# Patient Record
Sex: Female | Born: 1975 | Race: White | Hispanic: No | Marital: Single | Smoking: Never smoker
Health system: Southern US, Community
[De-identification: ages and names within clinical notes are randomized; demographics above are authoritative.]

## PROBLEM LIST (undated history)

## (undated) DIAGNOSIS — Z8619 Personal history of other infectious and parasitic diseases: Secondary | ICD-10-CM

## (undated) DIAGNOSIS — E079 Disorder of thyroid, unspecified: Secondary | ICD-10-CM

## (undated) DIAGNOSIS — D497 Neoplasm of unspecified behavior of endocrine glands and other parts of nervous system: Secondary | ICD-10-CM

## (undated) DIAGNOSIS — I10 Essential (primary) hypertension: Secondary | ICD-10-CM

## (undated) DIAGNOSIS — E2740 Unspecified adrenocortical insufficiency: Secondary | ICD-10-CM

## (undated) DIAGNOSIS — K219 Gastro-esophageal reflux disease without esophagitis: Secondary | ICD-10-CM

## (undated) HISTORY — DX: Neoplasm of unspecified behavior of endocrine glands and other parts of nervous system: D49.7

## (undated) HISTORY — DX: Unspecified adrenocortical insufficiency: E27.40

## (undated) HISTORY — DX: Gastro-esophageal reflux disease without esophagitis: K21.9

## (undated) HISTORY — PX: TRANSPHENOIDAL / TRANSNASAL HYPOPHYSECTOMY / RESECTION PITUITARY TUMOR: SUR1382

## (undated) HISTORY — DX: Essential (primary) hypertension: I10

## (undated) HISTORY — PX: EYE SURGERY: SHX253

## (undated) HISTORY — DX: Disorder of thyroid, unspecified: E07.9

## (undated) HISTORY — DX: Personal history of other infectious and parasitic diseases: Z86.19

---

## 1998-08-16 ENCOUNTER — Emergency Department (HOSPITAL_COMMUNITY): Admission: EM | Admit: 1998-08-16 | Discharge: 1998-08-17 | Payer: Self-pay | Admitting: Emergency Medicine

## 1998-08-20 ENCOUNTER — Encounter: Payer: Self-pay | Admitting: Emergency Medicine

## 1998-08-20 ENCOUNTER — Ambulatory Visit (HOSPITAL_COMMUNITY): Admission: RE | Admit: 1998-08-20 | Discharge: 1998-08-20 | Payer: Self-pay | Admitting: Emergency Medicine

## 1998-09-07 ENCOUNTER — Encounter: Payer: Self-pay | Admitting: Gastroenterology

## 1998-09-07 ENCOUNTER — Ambulatory Visit (HOSPITAL_COMMUNITY): Admission: RE | Admit: 1998-09-07 | Discharge: 1998-09-07 | Payer: Self-pay | Admitting: Gastroenterology

## 1998-09-17 ENCOUNTER — Encounter: Payer: Self-pay | Admitting: Emergency Medicine

## 1998-09-17 ENCOUNTER — Emergency Department (HOSPITAL_COMMUNITY): Admission: EM | Admit: 1998-09-17 | Discharge: 1998-09-17 | Payer: Self-pay | Admitting: Emergency Medicine

## 1998-09-19 ENCOUNTER — Ambulatory Visit (HOSPITAL_COMMUNITY): Admission: RE | Admit: 1998-09-19 | Discharge: 1998-09-19 | Payer: Self-pay | Admitting: Gastroenterology

## 1998-09-19 ENCOUNTER — Emergency Department (HOSPITAL_COMMUNITY): Admission: EM | Admit: 1998-09-19 | Discharge: 1998-09-20 | Payer: Self-pay | Admitting: Emergency Medicine

## 1998-10-24 ENCOUNTER — Other Ambulatory Visit: Admission: RE | Admit: 1998-10-24 | Discharge: 1998-10-24 | Payer: Self-pay | Admitting: Obstetrics and Gynecology

## 2001-01-25 ENCOUNTER — Other Ambulatory Visit: Admission: RE | Admit: 2001-01-25 | Discharge: 2001-01-25 | Payer: Self-pay | Admitting: Obstetrics and Gynecology

## 2002-01-31 ENCOUNTER — Other Ambulatory Visit: Admission: RE | Admit: 2002-01-31 | Discharge: 2002-01-31 | Payer: Self-pay | Admitting: Obstetrics and Gynecology

## 2002-09-28 ENCOUNTER — Emergency Department (HOSPITAL_COMMUNITY): Admission: EM | Admit: 2002-09-28 | Discharge: 2002-09-29 | Payer: Self-pay | Admitting: Emergency Medicine

## 2002-09-29 ENCOUNTER — Encounter: Payer: Self-pay | Admitting: Internal Medicine

## 2002-09-29 ENCOUNTER — Observation Stay (HOSPITAL_COMMUNITY): Admission: EM | Admit: 2002-09-29 | Discharge: 2002-09-30 | Payer: Self-pay | Admitting: Emergency Medicine

## 2002-09-30 ENCOUNTER — Encounter (INDEPENDENT_AMBULATORY_CARE_PROVIDER_SITE_OTHER): Payer: Self-pay | Admitting: Cardiology

## 2002-10-05 ENCOUNTER — Encounter: Payer: Self-pay | Admitting: Family Medicine

## 2002-10-05 ENCOUNTER — Encounter: Admission: RE | Admit: 2002-10-05 | Discharge: 2002-10-05 | Payer: Self-pay | Admitting: Family Medicine

## 2002-12-11 ENCOUNTER — Ambulatory Visit (HOSPITAL_COMMUNITY): Admission: RE | Admit: 2002-12-11 | Discharge: 2002-12-11 | Payer: Self-pay | Admitting: Endocrinology

## 2002-12-13 ENCOUNTER — Observation Stay (HOSPITAL_COMMUNITY): Admission: EM | Admit: 2002-12-13 | Discharge: 2002-12-14 | Payer: Self-pay | Admitting: Emergency Medicine

## 2002-12-20 ENCOUNTER — Ambulatory Visit (HOSPITAL_COMMUNITY): Admission: RE | Admit: 2002-12-20 | Discharge: 2002-12-20 | Payer: Self-pay | Admitting: Otolaryngology

## 2003-02-08 ENCOUNTER — Other Ambulatory Visit: Admission: RE | Admit: 2003-02-08 | Discharge: 2003-02-08 | Payer: Self-pay | Admitting: Obstetrics and Gynecology

## 2003-09-14 HISTORY — PX: OTHER SURGICAL HISTORY: SHX169

## 2004-02-13 ENCOUNTER — Other Ambulatory Visit: Admission: RE | Admit: 2004-02-13 | Discharge: 2004-02-13 | Payer: Self-pay | Admitting: Obstetrics and Gynecology

## 2004-04-10 ENCOUNTER — Ambulatory Visit (HOSPITAL_COMMUNITY): Admission: RE | Admit: 2004-04-10 | Discharge: 2004-04-10 | Payer: Self-pay | Admitting: Neurosurgery

## 2004-05-17 ENCOUNTER — Ambulatory Visit (HOSPITAL_COMMUNITY): Admission: RE | Admit: 2004-05-17 | Discharge: 2004-05-17 | Payer: Self-pay | Admitting: Endocrinology

## 2004-12-25 ENCOUNTER — Other Ambulatory Visit: Admission: RE | Admit: 2004-12-25 | Discharge: 2004-12-25 | Payer: Self-pay | Admitting: Obstetrics and Gynecology

## 2006-05-18 ENCOUNTER — Other Ambulatory Visit: Admission: RE | Admit: 2006-05-18 | Discharge: 2006-05-18 | Payer: Self-pay | Admitting: Obstetrics and Gynecology

## 2006-10-12 ENCOUNTER — Ambulatory Visit (HOSPITAL_COMMUNITY): Admission: RE | Admit: 2006-10-12 | Discharge: 2006-10-12 | Payer: Self-pay | Admitting: Endocrinology

## 2007-05-28 ENCOUNTER — Other Ambulatory Visit: Admission: RE | Admit: 2007-05-28 | Discharge: 2007-05-28 | Payer: Self-pay | Admitting: Obstetrics and Gynecology

## 2007-07-20 ENCOUNTER — Emergency Department (HOSPITAL_COMMUNITY): Admission: EM | Admit: 2007-07-20 | Discharge: 2007-07-20 | Payer: Self-pay | Admitting: Family Medicine

## 2008-05-30 ENCOUNTER — Other Ambulatory Visit: Admission: RE | Admit: 2008-05-30 | Discharge: 2008-05-30 | Payer: Self-pay | Admitting: Obstetrics and Gynecology

## 2009-05-30 ENCOUNTER — Other Ambulatory Visit: Admission: RE | Admit: 2009-05-30 | Discharge: 2009-05-30 | Payer: Self-pay | Admitting: Obstetrics and Gynecology

## 2009-10-22 ENCOUNTER — Encounter: Admission: RE | Admit: 2009-10-22 | Discharge: 2009-10-22 | Payer: Self-pay | Admitting: Endocrinology

## 2009-10-24 ENCOUNTER — Ambulatory Visit: Payer: Self-pay | Admitting: Oncology

## 2009-10-26 ENCOUNTER — Encounter: Admission: RE | Admit: 2009-10-26 | Discharge: 2009-10-26 | Payer: Self-pay | Admitting: Endocrinology

## 2010-02-02 ENCOUNTER — Encounter: Payer: Self-pay | Admitting: Endocrinology

## 2010-02-02 ENCOUNTER — Encounter: Payer: Self-pay | Admitting: Otolaryngology

## 2010-05-31 NOTE — Discharge Summary (Signed)
[name], [name]                       ACCOUNT NO.:  [id number]   MEDICAL RECORD NO.:  [id number]                   PATIENT TYPE:  INP   LOCATION:  2004                                 FACILITY:  [facility]   PHYSICIAN:  [name], M.D.             DATE OF BIRTH:  09-10-1975   DATE OF ADMISSION:  12/13/2002  DATE OF DISCHARGE:  12/14/2002                                 DISCHARGE SUMMARY   DISCHARGE DIAGNOSES:  1. Near-syncope.  2. Headaches with transient fever and generalized weakness and dizziness,     likely secondary to viral syndrome, improved.  3. History of tachycardia of unknown etiology with complete cardiac     evaluation.  4. Probable Cushing's syndrome, patient currently undergoing endocrinologic     evaluation.   DISCHARGE MEDICATIONS:  The patient should continue all her preadmission  medications.  She may use Tylenol over-the-counter for headaches, which are  currently very mild and significantly improved.   DISCHARGE LABORATORY DATA:  WBC count 6.7, hemoglobin 15.5, hematocrit 44.5,  MCV 92.3, platelet count 204.  Sodium 138, potassium 4.0, chloride 105, CO2  25, glucose 91, BUN 5, creatinine 0.8, calcium 9.1.   CONSULTATIONS:  Not applicable.   PROCEDURES:  Not applicable.   CONDITION ON DISCHARGE:  Improved and stable.   DISCHARGE INSTRUCTIONS:  Activity is to be as tolerated.  The patient is to  increase her fluid intake.  Diet to be a regular diet.  She will follow up  with Dorisann Snow, M.D., of endocrinology at [facility] on  Friday, December 16, 2002, at 8:20 a.m.  She is to report to M.D. if there  are any problems including but not limited to dizziness, generalized  weakness, palpitations, chest pain, orthopnea, fever, or chills.   REASON FOR ADMISSION:  Presyncope.  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  She had been in her  usual state of health until sometime  on the evening of presentation, then started having some headaches, followed  by some dizziness with ________.  She also felt slightly weak.  She did not  have any other significant cardiopulmonary symptomatology.  At the ED she  was found to be febrile with tachycardia of 138 per minute and a temperature  of 101.1 degrees Fahrenheit, and therefore the hospitalist service was  called to evaluate the patient.  She was subsequently admitted for  management and observation and evaluation of her presyncopal episode.  Please see the admission H&P dictated by me on December 13, 2002, for  further insight, the patient's admitting symptoms and signs, assessment and  plan.   HOSPITAL COURSE:  She was admitted to telemetry monitoring.  She was started  on her home dose of Toprol XL, and she also received supportive care for  headaches.  She received IV fluid supplementation.  Cardiac enzymes serially  were obtained, which ruled out any myocardial infarction.  Her UA was  unremarkable.  Chest x-ray was negative for any acute infiltrates.  This  morning the patient's symptoms have completely resolved.  She is not having  any significant headaches, she is afebrile, her heart rate is down to 88 per  minute by her radial pulse this morning.  She does not have any dizziness,  and her energy level was improved and she is ready for discharge.  It is of  note that this patient has had a history of tachycardia for which she saw  Dr. Fraser Din in the hospital a few months ago and also in the outpatient  setting.  She has had several cardiac evaluations, including stresses and 2  D echo, which have been unremarkable.  She has a history of hypertension,  for which she takes Toprol XL to control her high blood pressure as well as  her tachycardia.  According to the patient, she had a similar episode a few  months ago and it is believed that her problems are related to some   underlying endocrinologic issue.  I discussed the patient extensively with  Dr. Talmage Snow, who had done an MRI on November 28, the results of which are  negative for any pituitary pathology, and she is thinking of possible  pituitary venous sampling at [facility] or [facility].  She is going to see the  patient in two days, i.e., Friday, to discuss these management plans.  At  bedside today I spent a good amount of time discussing the patient with the  patient's grandparents as well as the patient, and they agreed that she has  had extensive cardiac workup and that appropriate follow-up with Dr. Talmage Snow  would be appreciated, and therefore I called Dr. Talmage Snow and asked for an  appointment on Friday as noted above.  The patient discharged home in stable  and satisfactory condition.   Her discharge BP is 144/94, with a heart rate of 88 per minute.  Her  telemetry monitoring did not reveal any dysrhythmia.  She does not have any  cardiopulmonary complaints at this point in time.  At this time the cause of  the patient's presyncopal episode is unclear; however, differentials would  include vasovagal reaction to the headache plus or minus a viral syndrome in  view of her fever and headaches and some mild general weakness.   NOTE:  I spent more than 30 minutes preparing this patient for discharge  discussing the follow-up and follow-up options with the patient and her  family at bedside today.  I also called Dr. Talmage Snow by telephone to discuss  the patient's clinical condition with her.                                                [name], M.D.    GO/MEDQ  D:  12/14/2002  T:  12/14/2002  Job:  [id number]   cc:   Meade Maw, M.D.  [address]  [place]  [place] [address]  Fax: [phone]   Dorisann Snow, M.D.  [phone] [address], [place] [address]  Fax: [phone]

## 2010-05-31 NOTE — H&P (Signed)
[name], Snow                       ACCOUNT NO.:  [id number]   MEDICAL RECORD NO.:  [id number]                   PATIENT TYPE:  INP   LOCATION:  2004                                 FACILITY:  [facility]   PHYSICIAN:  [name], M.D.             DATE OF BIRTH:  1975-11-22   DATE OF ADMISSION:  12/13/2002  DATE OF DISCHARGE:                                HISTORY & PHYSICAL   PROBLEM LIST:  1. Presyncope.  2. Headaches.  3. History of tachycardia.   CHIEF COMPLAINT:  Presyncope.   HISTORY OF PRESENT ILLNESS:  The patient is a 35 year old Caucasian lady who  presents with above chief complaint. She had apparently been in her usual  state of health until some time this evening when she started complaining of  some headaches for about three hours followed by some dizziness without any  vertiginous component. She did not have any shortness of breath, nausea,  vomiting, generalized weakness or visual changes. She was transported to the  ER by EMS on account of light-headedness making her feel like she was going  to pass out. She does not give any history of chest pain, palpitations,  paroxysmal nocturnal dyspnea or orthopnea but admitted to feeling generally  weak for a transient period. At the ED, the patient was noted to be  hypertensive with a BP of 180/105 and tachycardic at 138 and febrile at  101.1 and therefore were called to evaluate for admission.   PAST MEDICAL HISTORY:  The patient has a history of tachycardia for which  she was admitted by Dr. Tresa Endo a few months ago. At this admission,  evaluation with a TSA, 2-D echocardiogram and _________ were all  unremarkable for cardiac causes or any other specific causes. She was also  seen by Dr. Fraser Din during this admission as an outpatient according to the  patient and she was told that the cause of her ________ is unclear. The  patient is currently being evaluated by Dr. Lurene Shadow of endocrinology for  pituitary  causes of Cushing's disease.  In fact, she had an MRI on November  28 at [facility] and the result is on the computer and official  report is read as being normal for MI of the brain and the pituitary gland.  The patient has not had the chance to discuss these findings with Dr.  Lurene Shadow at this moment. Currently she is getting Toprol for the treatment of  her hypertension with tachycardia of unknown etiology.   MEDICATIONS:  She is allergic to PENICILLIN and ERYTHROMYCIN.  She takes  Toprol 200 mg b.i.d.  She also takes birth control pills.   SOCIAL HISTORY:  The patient is single, she does not have any children, she  is a Engineer, civil (consulting) at [employer]. She does not smoke cigarettes nor drink  alcohol.   REVIEW OF SYMPTOMS:  Negative as in HPI.  __________ unremarkable.   PHYSICAL EXAMINATION:  VITAL SIGNS:  Blood pressure was 138/107, pulse rate  of 128, respiratory rate 30 as documented by the ED nurse, however, her  respiratory rate was 22 at the time of my evaluation. O2 saturation 99% on  room air. Temperature which was 101.1 was 99.2 at the time of my evaluation.  GENERAL:  Not in acute cardiopulmonary distress.  CNS:  Pupils were equal, round and reactive to light. She did not have any  positive Brudzinski or Kernig's sign. There were no focal deficits.  HEENT:  Normocephalic, atraumatic. Extraocular movements intact. She was not  pale. She did not have any dryness of her mucous membranes.  NECK:  Supple with no thyromegaly.  LUNGS:  Clear to auscultation.  CARDIAC:  Notable for tachycardia without any gallops or murmur.  ABDOMEN:  Full, no significant hepatosplenomegaly. Bowel sounds were within  normal limits.  EXTREMITIES:  Negative for any pedal edema.  SKIN:  Warm and dry without any lesions.   LABORATORY DATA:  WBC count 5.9, hemoglobin 15.6, hematocrit 44.5, MCV 92.8,  platelet count 265. Urinalysis was negative.   ASSESSMENT:  Presyncopal episode likely  secondary to vasovagal reaction to  her moderate headaches which has improved at the time of my evaluation. The  patient has fever, etiology of which is unclear at this moment. We do not  think that she has meningitis. The patient does not have any focal deficits,  she is not ill looking. She does not have any neck stiffness on exam or by  history. She does not have any positive Brudzinski or Kernig's sign.   PLAN:  Admit the patient for 24 hour observation. We will start her on beta  blockade for control of her tachycardia which apparently has been  extensively worked up. The cause of her fever is unclear as mentioned above.  We will repeat a rectal temperature and get x-ray with urinalysis amongst  others to rule out any infectious etiology. She will be offered supportive  care for now.                                                [name], M.D.    GO/MEDQ  D:  12/13/2002  T:  12/13/2002  Job:  [id number]   cc:   Leonie Man, M.D.  [address]  [place]  [place] [address]  Fax: [phone]

## 2010-09-09 ENCOUNTER — Other Ambulatory Visit: Payer: Self-pay | Admitting: Obstetrics and Gynecology

## 2010-09-09 ENCOUNTER — Other Ambulatory Visit (HOSPITAL_COMMUNITY)
Admission: RE | Admit: 2010-09-09 | Discharge: 2010-09-09 | Disposition: A | Discharge status: Home / Self Care | Payer: Medicare Other | Source: Ambulatory Visit | Attending: Obstetrics and Gynecology | Admitting: Obstetrics and Gynecology

## 2010-09-09 DIAGNOSIS — Z124 Encounter for screening for malignant neoplasm of cervix: Secondary | ICD-10-CM | POA: Insufficient documentation

## 2010-10-24 LAB — GLUCOSE, RANDOM
Glucose, Bld: 75
Glucose, Bld: 77
Glucose, Bld: 80
Glucose, Bld: 80

## 2010-10-24 LAB — GROWTH HORMONE
Growth Hormone: 0.08
Growth Hormone: 0.11
Growth Hormone: 0.2
Growth Hormone: 0.37

## 2011-09-12 ENCOUNTER — Other Ambulatory Visit: Payer: Self-pay | Admitting: Obstetrics and Gynecology

## 2011-09-12 ENCOUNTER — Other Ambulatory Visit (HOSPITAL_COMMUNITY)
Admission: RE | Admit: 2011-09-12 | Discharge: 2011-09-12 | Disposition: A | Discharge status: Home / Self Care | Payer: Medicare Other | Source: Ambulatory Visit | Attending: Obstetrics and Gynecology | Admitting: Obstetrics and Gynecology

## 2011-09-12 DIAGNOSIS — Z124 Encounter for screening for malignant neoplasm of cervix: Secondary | ICD-10-CM | POA: Insufficient documentation

## 2012-12-13 ENCOUNTER — Other Ambulatory Visit: Payer: Self-pay | Admitting: Obstetrics and Gynecology

## 2012-12-13 ENCOUNTER — Other Ambulatory Visit (HOSPITAL_COMMUNITY)
Admission: RE | Admit: 2012-12-13 | Discharge: 2012-12-13 | Disposition: A | Discharge status: Home / Self Care | Payer: Medicare Other | Source: Ambulatory Visit | Attending: Obstetrics and Gynecology | Admitting: Obstetrics and Gynecology

## 2012-12-13 DIAGNOSIS — Z01419 Encounter for gynecological examination (general) (routine) without abnormal findings: Secondary | ICD-10-CM | POA: Insufficient documentation

## 2012-12-13 DIAGNOSIS — Z1151 Encounter for screening for human papillomavirus (HPV): Secondary | ICD-10-CM | POA: Insufficient documentation

## 2013-12-27 ENCOUNTER — Other Ambulatory Visit (HOSPITAL_COMMUNITY)
Admission: RE | Admit: 2013-12-27 | Discharge: 2013-12-27 | Disposition: A | Discharge status: Home / Self Care | Payer: Medicare Other | Source: Ambulatory Visit | Attending: Obstetrics and Gynecology | Admitting: Obstetrics and Gynecology

## 2013-12-27 ENCOUNTER — Other Ambulatory Visit: Payer: Self-pay | Admitting: Obstetrics and Gynecology

## 2013-12-27 DIAGNOSIS — Z124 Encounter for screening for malignant neoplasm of cervix: Secondary | ICD-10-CM | POA: Diagnosis present

## 2013-12-27 DIAGNOSIS — Z1151 Encounter for screening for human papillomavirus (HPV): Secondary | ICD-10-CM | POA: Insufficient documentation

## 2013-12-30 LAB — CYTOLOGY - PAP

## 2015-03-27 DIAGNOSIS — R638 Other symptoms and signs concerning food and fluid intake: Secondary | ICD-10-CM | POA: Diagnosis not present

## 2015-04-03 DIAGNOSIS — E2749 Other adrenocortical insufficiency: Secondary | ICD-10-CM | POA: Diagnosis not present

## 2015-04-03 DIAGNOSIS — E240 Pituitary-dependent Cushing's disease: Secondary | ICD-10-CM | POA: Diagnosis not present

## 2015-04-03 DIAGNOSIS — E230 Hypopituitarism: Secondary | ICD-10-CM | POA: Diagnosis not present

## 2015-04-03 DIAGNOSIS — I10 Essential (primary) hypertension: Secondary | ICD-10-CM | POA: Diagnosis not present

## 2015-07-23 DIAGNOSIS — H5213 Myopia, bilateral: Secondary | ICD-10-CM | POA: Diagnosis not present

## 2015-09-04 DIAGNOSIS — R7301 Impaired fasting glucose: Secondary | ICD-10-CM | POA: Diagnosis not present

## 2015-09-04 DIAGNOSIS — E038 Other specified hypothyroidism: Secondary | ICD-10-CM | POA: Diagnosis not present

## 2015-09-04 DIAGNOSIS — E230 Hypopituitarism: Secondary | ICD-10-CM | POA: Diagnosis not present

## 2015-09-04 DIAGNOSIS — E2749 Other adrenocortical insufficiency: Secondary | ICD-10-CM | POA: Diagnosis not present

## 2015-09-04 DIAGNOSIS — E240 Pituitary-dependent Cushing's disease: Secondary | ICD-10-CM | POA: Diagnosis not present

## 2015-09-04 DIAGNOSIS — E28310 Symptomatic premature menopause: Secondary | ICD-10-CM | POA: Diagnosis not present

## 2015-09-04 LAB — BASIC METABOLIC PANEL
BUN: 13 mg/dL (ref 4–21)
CREATININE: 0.8 mg/dL (ref <=1.1)
Glucose: 86 mg/dL
POTASSIUM: 4 mmol/L (ref 3.4–5.3)
SODIUM: 139 mmol/L (ref 137–147)

## 2015-09-04 LAB — HEMOGLOBIN A1C: Hemoglobin A1C: 5.4

## 2015-10-18 DIAGNOSIS — Z23 Encounter for immunization: Secondary | ICD-10-CM | POA: Diagnosis not present

## 2015-10-18 DIAGNOSIS — I10 Essential (primary) hypertension: Secondary | ICD-10-CM | POA: Diagnosis not present

## 2015-10-18 DIAGNOSIS — E230 Hypopituitarism: Secondary | ICD-10-CM | POA: Diagnosis not present

## 2015-10-18 DIAGNOSIS — E2749 Other adrenocortical insufficiency: Secondary | ICD-10-CM | POA: Diagnosis not present

## 2015-10-18 DIAGNOSIS — R5383 Other fatigue: Secondary | ICD-10-CM | POA: Diagnosis not present

## 2015-10-18 DIAGNOSIS — E240 Pituitary-dependent Cushing's disease: Secondary | ICD-10-CM | POA: Diagnosis not present

## 2015-10-18 DIAGNOSIS — Z7952 Long term (current) use of systemic steroids: Secondary | ICD-10-CM | POA: Diagnosis not present

## 2015-10-18 LAB — HEPATIC FUNCTION PANEL
ALK PHOS: 50 U/L (ref 25–125)
ALT: 32 U/L (ref 7–35)
AST: 33 U/L (ref 13–35)
Bilirubin, Direct: 0.11 mg/dL
Bilirubin, Total: 0.4 mg/dL

## 2015-10-18 LAB — CBC AND DIFFERENTIAL
HCT: 45 % (ref 36–46)
HEMOGLOBIN: 15.3 g/dL (ref 12.0–16.0)
WBC: 8.5 10^3/mL

## 2015-10-18 LAB — HM DEXA SCAN: HM Dexa Scan: NORMAL

## 2015-10-18 LAB — VITAMIN D 25 HYDROXY (VIT D DEFICIENCY, FRACTURES): VIT D 25 HYDROXY: 41.3

## 2015-12-09 DIAGNOSIS — R5383 Other fatigue: Secondary | ICD-10-CM | POA: Diagnosis not present

## 2015-12-09 DIAGNOSIS — R0683 Snoring: Secondary | ICD-10-CM | POA: Diagnosis not present

## 2016-05-02 ENCOUNTER — Encounter: Payer: Self-pay | Admitting: Physician Assistant

## 2016-05-02 ENCOUNTER — Other Ambulatory Visit: Payer: Self-pay | Admitting: Physician Assistant

## 2016-05-02 ENCOUNTER — Ambulatory Visit (INDEPENDENT_AMBULATORY_CARE_PROVIDER_SITE_OTHER): Payer: Medicare Other | Admitting: Physician Assistant

## 2016-05-02 VITALS — BP 130/88 | HR 104 | Temp 98.8°F | Resp 16 | Ht 65.0 in | Wt 234.0 lb

## 2016-05-02 DIAGNOSIS — K2990 Gastroduodenitis, unspecified, without bleeding: Secondary | ICD-10-CM | POA: Diagnosis not present

## 2016-05-02 DIAGNOSIS — E2740 Unspecified adrenocortical insufficiency: Secondary | ICD-10-CM | POA: Diagnosis not present

## 2016-05-02 DIAGNOSIS — E039 Hypothyroidism, unspecified: Secondary | ICD-10-CM | POA: Diagnosis not present

## 2016-05-02 DIAGNOSIS — E240 Pituitary-dependent Cushing's disease: Secondary | ICD-10-CM | POA: Diagnosis not present

## 2016-05-02 LAB — COMPREHENSIVE METABOLIC PANEL
ALT: 12 U/L (ref 0–35)
AST: 11 U/L (ref 0–37)
Albumin: 4.4 g/dL (ref 3.5–5.2)
Alkaline Phosphatase: 40 U/L (ref 39–117)
BUN: 11 mg/dL (ref 6–23)
CO2: 28 mEq/L (ref 19–32)
Calcium: 9.5 mg/dL (ref 8.4–10.5)
Chloride: 102 mEq/L (ref 96–112)
Creatinine, Ser: 0.88 mg/dL (ref 0.40–1.20)
GFR: 76.26 mL/min (ref >60.00)
Glucose, Bld: 95 mg/dL (ref 70–99)
Potassium: 4.2 mEq/L (ref 3.5–5.1)
Sodium: 138 mEq/L (ref 135–145)
Total Bilirubin: 0.7 mg/dL (ref 0.2–1.2)
Total Protein: 7.2 g/dL (ref 6.0–8.3)

## 2016-05-02 LAB — CBC
HCT: 45.6 % (ref 36.0–46.0)
Hemoglobin: 15.7 g/dL — ABNORMAL HIGH (ref 12.0–15.0)
MCHC: 34.3 g/dL (ref 30.0–36.0)
MCV: 91.3 fl (ref 78.0–100.0)
Platelets: 254 10*3/uL (ref 150.0–400.0)
RBC: 5 Mil/uL (ref 3.87–5.11)
RDW: 13.4 % (ref 11.5–15.5)
WBC: 10.2 10*3/uL (ref 4.0–10.5)

## 2016-05-02 LAB — H. PYLORI ANTIBODY, IGG: H Pylori IgG: NEGATIVE

## 2016-05-02 MED ORDER — SUCRALFATE 1 G PO TABS: 1.0000 g | ORAL_TABLET | Freq: Three times a day (TID) | ORAL | 0 refills | Status: DC

## 2016-05-02 MED ORDER — PANTOPRAZOLE SODIUM 40 MG PO TBEC: 40.0000 mg | DELAYED_RELEASE_TABLET | Freq: Every day | ORAL | 3 refills | Status: DC

## 2016-05-02 NOTE — Progress Notes (Signed)
Patient presents to clinic today to establish care.  Acute Concerns: Patient with history of GERD, currently on Prilosec. Notes over the past several weeks having increased heart burn with epigastric pain occurring about 30 minutes after meals. Note pain improves while eating. Denies melena, hematochezia or tenesmus. Denies history of ulceration. Denies any alcohol consumption or NSAID use.  Chronic Issues: Cushing's Disease/Hypopituitarism/Hypothyroidism -- Followed by Endocrinology Chalmers Cater) and GYN. Endorses taking medications as directed. Has follow-up scheduled with Dr. Chalmers Cater.   Health Maintenance: Immunizations -- Unsure of Tetanus. Will obtain records. PAP --  Up-to-date/  Past Medical History:  Diagnosis Date  . Adrenal insufficiency ([place])   . GERD (gastroesophageal reflux disease)   . History of chickenpox   . Hypertension   . Pituitary tumor   . Thyroid disease    Cushing's Disease    Past Surgical History:  Procedure Laterality Date  . gamma knife radiation  09/2003  . TRANSPHENOIDAL / TRANSNASAL HYPOPHYSECTOMY / RESECTION PITUITARY TUMOR  02/2003, 07/2003    No current outpatient prescriptions on file prior to visit.   No current facility-administered medications on file prior to visit.     Allergies  Allergen Reactions  . Erythromycin Nausea And Vomiting  . Penicillins Rash    Family History  Problem Relation Age of Onset  . Hypertension Paternal Aunt   . Hyperlipidemia Paternal Aunt   . Drug abuse Paternal Uncle   . Stroke Paternal Uncle   . Heart attack Paternal Uncle   . Hypertension Maternal Grandfather   . Hyperlipidemia Maternal Grandfather   . Cancer Maternal Grandfather     Prostate  . Heart attack Maternal Grandfather   . Hyperlipidemia Paternal Grandmother   . Hypertension Paternal Grandmother   . Dementia Paternal Grandmother   . Hyperlipidemia Paternal Grandfather   . Hypertension Paternal Grandfather   . COPD Paternal Grandfather     . Diabetes Paternal Grandfather   . Heart attack Paternal Grandfather   . Cancer Paternal Aunt     Breast    Social History   Social History  . Marital status: Single    Spouse name: N/A  . Number of children: N/A  . Years of education: N/A   Occupational History  . Not on file.   Social History Main Topics  . Smoking status: Never Smoker  . Smokeless tobacco: Never Used  . Alcohol use No  . Drug use: No  . Sexual activity: Yes    Birth control/ protection: Condom   Other Topics Concern  . Not on file   Social History Narrative  . No narrative on file   Review of Systems  Constitutional: Negative for chills, fever and weight loss.  Eyes: Negative for blurred vision and double vision.  Respiratory: Negative for cough and shortness of breath.   Cardiovascular: Negative for chest pain and palpitations.  Gastrointestinal: Positive for abdominal pain, heartburn and nausea. Negative for blood in stool, constipation, diarrhea and vomiting.  Genitourinary: Negative for dysuria, flank pain, frequency and hematuria.  Neurological: Negative for dizziness.  Psychiatric/Behavioral: Negative for depression. The patient is not nervous/anxious and does not have insomnia.    BP 130/88   Pulse (!) 104   Temp 98.8 F (37.1 C) (Oral)   Resp 16   Ht 5\' 5"  (1.651 m)   Wt 234 lb (106.1 kg)   SpO2 99%   BMI 38.94 kg/m   Physical Exam  Constitutional: She is oriented to person, place, and time and well-developed, well-nourished,  and in no distress.  HENT:  Head: Normocephalic and atraumatic.  Moon facies noted.   Eyes: Conjunctivae are normal.  Neck: Neck supple.  Cardiovascular: Normal rate, regular rhythm, normal heart sounds and intact distal pulses.   Pulmonary/Chest: Effort normal and breath sounds normal. No respiratory distress. She has no wheezes. She has no rales. She exhibits no tenderness.  Abdominal: Soft. Normal appearance and bowel sounds are normal. There is no  hepatosplenomegaly. There is tenderness in the epigastric area and left upper quadrant. There is no rigidity, no rebound, no guarding, no CVA tenderness and no tenderness at McBurney's point.  Neurological: She is alert and oriented to person, place, and time. No cranial nerve deficit.  Skin: Skin is warm and dry. No rash noted.  Psychiatric: Affect normal.  Vitals reviewed.  Assessment/Plan: Adrenal insufficiency ([place]) Followed by Endocrinology. Continue care as directed by specialist.  Hypothyroid Tolerating levothyroxine. Followed by Endocrinology. Follow-up with specialist as scheduled.   Cushing's disease ([place]) Followed by Endo. Continue care as discussed by specialist.   Gastritis and duodenitis Despite current Prilosec use. Concern for ulcer giving history. Discussed appropriate diet. Start probiotic daily. Will switch Prilosec for Protonix. Start Carafate. Avoid NSAIDs. No alcohol. Will check labs today to include CBC, Lipase, h. Pylori. IFOB given. If labs unremarkable and symptoms not quickly improving, will refer to GI for EGD and further management.     Leeanne Rio, PA-C

## 2016-05-02 NOTE — Progress Notes (Signed)
Pre visit review using our clinic review tool, if applicable. No additional management support is needed unless otherwise documented below in the visit note. 

## 2016-05-02 NOTE — Patient Instructions (Signed)
Please stop the Omeprazole. Start the Protonix and the Carafate as directed. Continue avoidance of trigger foods.   I am checking labs today to further assess. If all negative, we will be setting you up with Gastroenterology for an upper endoscopy.   Please follow-up with me at earliest convenience for a complete physical.   It was a pleasure meeting you today and we look forward to participating in your care!

## 2016-05-04 DIAGNOSIS — K2990 Gastroduodenitis, unspecified, without bleeding: Secondary | ICD-10-CM | POA: Insufficient documentation

## 2016-05-04 NOTE — Assessment & Plan Note (Signed)
Followed by Endocrinology. Continue care as directed by specialist. 

## 2016-05-04 NOTE — Assessment & Plan Note (Signed)
Despite current Prilosec use. Concern for ulcer giving history. Discussed appropriate diet. Start probiotic daily. Will switch Prilosec for Protonix. Start Carafate. Avoid NSAIDs. No alcohol. Will check labs today to include CBC, Lipase, h. Pylori. IFOB given. If labs unremarkable and symptoms not quickly improving, will refer to GI for EGD and further management.

## 2016-05-04 NOTE — Assessment & Plan Note (Signed)
Tolerating levothyroxine. Followed by Endocrinology. Follow-up with specialist as scheduled.

## 2016-05-04 NOTE — Assessment & Plan Note (Signed)
Followed by Endo. Continue care as discussed by specialist.

## 2016-05-05 ENCOUNTER — Other Ambulatory Visit: Payer: Self-pay | Admitting: Physician Assistant

## 2016-05-05 ENCOUNTER — Encounter: Payer: Self-pay | Admitting: Gastroenterology

## 2016-05-05 DIAGNOSIS — K2990 Gastroduodenitis, unspecified, without bleeding: Secondary | ICD-10-CM

## 2016-05-16 ENCOUNTER — Encounter: Payer: Self-pay | Admitting: Physician Assistant

## 2016-05-16 DIAGNOSIS — K2990 Gastroduodenitis, unspecified, without bleeding: Secondary | ICD-10-CM

## 2016-05-16 MED ORDER — SUCRALFATE 1 G PO TABS: 1.0000 g | ORAL_TABLET | Freq: Three times a day (TID) | ORAL | 0 refills | Status: DC

## 2016-05-19 ENCOUNTER — Other Ambulatory Visit: Payer: Self-pay | Admitting: Physician Assistant

## 2016-05-19 DIAGNOSIS — K2990 Gastroduodenitis, unspecified, without bleeding: Secondary | ICD-10-CM

## 2016-05-22 ENCOUNTER — Encounter: Payer: Self-pay | Admitting: Emergency Medicine

## 2016-05-26 ENCOUNTER — Encounter: Payer: Self-pay | Admitting: Physician Assistant

## 2016-05-26 DIAGNOSIS — E230 Hypopituitarism: Secondary | ICD-10-CM | POA: Insufficient documentation

## 2016-06-05 ENCOUNTER — Other Ambulatory Visit: Payer: Self-pay | Admitting: Family Medicine

## 2016-06-05 DIAGNOSIS — K2990 Gastroduodenitis, unspecified, without bleeding: Secondary | ICD-10-CM

## 2016-06-10 ENCOUNTER — Ambulatory Visit (INDEPENDENT_AMBULATORY_CARE_PROVIDER_SITE_OTHER): Payer: Medicare Other | Admitting: Gastroenterology

## 2016-06-10 ENCOUNTER — Encounter: Payer: Self-pay | Admitting: Gastroenterology

## 2016-06-10 VITALS — BP 140/90 | HR 88 | Ht 65.0 in | Wt 226.0 lb

## 2016-06-10 DIAGNOSIS — R1013 Epigastric pain: Secondary | ICD-10-CM

## 2016-06-10 DIAGNOSIS — R12 Heartburn: Secondary | ICD-10-CM | POA: Diagnosis not present

## 2016-06-10 NOTE — Progress Notes (Signed)
HPI: This is a very pleasant 41 year old woman  who was referred to me by Brunetta Jeans, PA-C  to evaluate  dyspepsia, epigastric burning .    Chief complaint is epigastric burning, dyspepsia, pyrosis  She has epiagstric burning, pains.  For past 2 months.  This is nearly constant.  Tomato sauces, fried foods make it worse.  Some foods can relieve the discomfort.  Also pyrosis into her chest.  Has been on PPI for years, started after her brain surgery.  Rarely had heartburn prior to past 2 months.  Has lost 10 poiunds in past 2 months.  Never takes NSAIDs.  Started protonix about a month, usually takes it after breakfast.  Takes carafate tid usually before breakfast.  She has noticed a slight improvement  Can be particularly bad at night laying down.  No dysphagia.  Old Data Reviewed:  Blood tests April 2018: Normal CBC, normal complete metabolic profile, H. pylori serology negative.   Review of systems: Pertinent positive and negative review of systems were noted in the above HPI section. All other review negative.   Past Medical History:  Diagnosis Date  . Adrenal insufficiency ([place])   . GERD (gastroesophageal reflux disease)   . History of chickenpox   . Hypertension   . Pituitary tumor   . Thyroid disease    Cushing's Disease    Past Surgical History:  Procedure Laterality Date  . gamma knife radiation  09/2003  . TRANSPHENOIDAL / TRANSNASAL HYPOPHYSECTOMY / RESECTION PITUITARY TUMOR  02/2003, 07/2003    Current Outpatient Prescriptions  Medication Sig Dispense Refill  . calcium carbonate (CALCIUM 600) 600 MG TABS tablet Take 600 mg by mouth daily with breakfast.    . Cholecalciferol (VITAMIN D) 2000 units CAPS Take 1 capsule by mouth daily.    Marland Kitchen levothyroxine (SYNTHROID, LEVOTHROID) 125 MCG tablet Take 1 tablet by mouth daily.  5  . LOPREEZA 1-0.5 MG tablet Take 1 tablet by mouth daily.  0  . metoprolol succinate (TOPROL-XL) 25 MG 24 hr tablet Take 3  tablets by mouth daily.    . pantoprazole (PROTONIX) 40 MG tablet Take 1 tablet (40 mg total) by mouth daily. 30 tablet 3  . predniSONE (DELTASONE) 5 MG tablet Take 1 tablet by mouth daily.  5  . Somatropin (NORDITROPIN) 5 MG/1.5ML SOLN Inject 1 mL into the skin at bedtime.    . sucralfate (CARAFATE) 1 g tablet Take 1 tablet (1 g total) by mouth 3 (three) times daily with meals. 60 tablet 0   No current facility-administered medications for this visit.     Allergies as of 06/10/2016 - Review Complete 06/10/2016  Allergen Reaction Noted  . Erythromycin Nausea And Vomiting 11/01/2010  . Penicillins Rash 11/01/2010    Family History  Problem Relation Age of Onset  . Hypertension Paternal Aunt   . Hyperlipidemia Paternal Aunt   . Drug abuse Paternal Uncle   . Stroke Paternal Uncle   . Heart attack Paternal Uncle   . Hypertension Maternal Grandfather   . Hyperlipidemia Maternal Grandfather   . Cancer Maternal Grandfather        Prostate  . Heart attack Maternal Grandfather   . Hyperlipidemia Paternal Grandmother   . Hypertension Paternal Grandmother   . Dementia Paternal Grandmother   . Hyperlipidemia Paternal Grandfather   . Hypertension Paternal Grandfather   . COPD Paternal Grandfather   . Diabetes Paternal Grandfather   . Heart attack Paternal Grandfather   . Cancer Paternal Aunt  Breast    Social History   Social History  . Marital status: Single    Spouse name: N/A  . Number of children: N/A  . Years of education: N/A   Occupational History  . Not on file.   Social History Main Topics  . Smoking status: Never Smoker  . Smokeless tobacco: Never Used  . Alcohol use No  . Drug use: No  . Sexual activity: Yes    Birth control/ protection: Condom   Other Topics Concern  . Not on file   Social History Narrative  . No narrative on file     Physical Exam: BP 140/90   Pulse 88   Ht 5\' 5"  (1.651 m)   Wt 226 lb (102.5 kg)   BMI 37.61 kg/m   Constitutional: generally well-appearing Psychiatric: alert and oriented x3 Eyes: extraocular movements intact Mouth: oral pharynx moist, no lesions Neck: supple no lymphadenopathy Cardiovascular: heart regular rate and rhythm Lungs: clear to auscultation bilaterally Abdomen: soft, nontender, nondistended, no obvious ascites, no peritoneal signs, normal bowel sounds Extremities: no lower extremity edema bilaterally Skin: no lesions on visible extremities   Assessment and plan: 41 y.o. female with  likely acid, GERD related symptoms  She has epigastric discomfort which is a burning sensation for her also some intermittent pyrosis. I suspect these are acid related. She is not taking her antiacid medicine at correct time and relation to meals and so she is going to correct that. I also don't think Carafate is necessary for now at least and so she is going to stop taking that. I recommended she have upper endoscopy to check for peptic ulcer disease, gastritis, H pylori infection of the stomach, significant GERD damage.    Please see the "Patient Instructions" section for addition details about the plan.   Owens Loffler, MD [facility] 06/10/2016, 2:44 PM  Cc: Brunetta Jeans, PA-C

## 2016-06-10 NOTE — Patient Instructions (Addendum)
Stop the carafate.  You should change the way you are taking your antiacid medicine (protonix) so that you are taking it 20-30 minutes prior to a decent meal as that is the way the pill is designed to work most effectively.  Take pepcid (or zantac) at bedtime every night.  You will be set up for an upper endoscopy for epigastric burning.

## 2016-07-07 ENCOUNTER — Other Ambulatory Visit (HOSPITAL_COMMUNITY)
Admission: RE | Admit: 2016-07-07 | Discharge: 2016-07-07 | Disposition: A | Discharge status: Home / Self Care | Payer: Medicare Other | Source: Ambulatory Visit | Attending: Obstetrics and Gynecology | Admitting: Obstetrics and Gynecology

## 2016-07-07 ENCOUNTER — Other Ambulatory Visit: Payer: Self-pay | Admitting: Obstetrics and Gynecology

## 2016-07-07 DIAGNOSIS — E288 Other ovarian dysfunction: Secondary | ICD-10-CM | POA: Diagnosis not present

## 2016-07-07 DIAGNOSIS — Z01419 Encounter for gynecological examination (general) (routine) without abnormal findings: Secondary | ICD-10-CM | POA: Diagnosis present

## 2016-07-07 DIAGNOSIS — Z01411 Encounter for gynecological examination (general) (routine) with abnormal findings: Secondary | ICD-10-CM | POA: Diagnosis not present

## 2016-07-08 LAB — CYTOLOGY - PAP
ADEQUACY: ABSENT
Diagnosis: NEGATIVE

## 2016-07-18 ENCOUNTER — Encounter: Payer: Self-pay | Admitting: Gastroenterology

## 2016-07-29 ENCOUNTER — Encounter: Payer: Self-pay | Admitting: Gastroenterology

## 2016-07-29 ENCOUNTER — Ambulatory Visit (AMBULATORY_SURGERY_CENTER): Payer: Medicare Other | Admitting: Gastroenterology

## 2016-07-29 VITALS — BP 146/90 | HR 84 | Temp 97.3°F | Resp 12 | Ht 65.0 in | Wt 226.0 lb

## 2016-07-29 DIAGNOSIS — K2990 Gastroduodenitis, unspecified, without bleeding: Secondary | ICD-10-CM

## 2016-07-29 DIAGNOSIS — K219 Gastro-esophageal reflux disease without esophagitis: Secondary | ICD-10-CM | POA: Diagnosis not present

## 2016-07-29 DIAGNOSIS — I10 Essential (primary) hypertension: Secondary | ICD-10-CM | POA: Diagnosis not present

## 2016-07-29 DIAGNOSIS — R1013 Epigastric pain: Secondary | ICD-10-CM | POA: Diagnosis present

## 2016-07-29 DIAGNOSIS — K2970 Gastritis, unspecified, without bleeding: Secondary | ICD-10-CM

## 2016-07-29 DIAGNOSIS — K2950 Unspecified chronic gastritis without bleeding: Secondary | ICD-10-CM | POA: Diagnosis not present

## 2016-07-29 MED ORDER — SODIUM CHLORIDE 0.9 % IV SOLN: 500.0000 mL | INTRAVENOUS | Status: DC

## 2016-07-29 NOTE — Progress Notes (Signed)
Called to room to assist during endoscopic procedure.  Patient ID and intended procedure confirmed with present staff. Received instructions for my participation in the procedure from the performing physician.  

## 2016-07-29 NOTE — Progress Notes (Signed)
Report given to PACU, vss 

## 2016-07-29 NOTE — Op Note (Signed)
[facility] Patient Name: [name] Procedure Date: 07/29/2016 2:33 PM MRN: [id number] Endoscopist: Milus Banister , MD Age: 41 Referring MD:  Date of Birth: [birth year] Gender: Female Account #: [id number] Procedure:                Upper GI endoscopy Indications:              Epigastric abdominal pain, Heartburn Medicines:                Monitored Anesthesia Care Procedure:                Pre-Anesthesia Assessment:                           - Prior to the procedure, a History and Physical                            was performed, and patient medications and                            allergies were reviewed. The patient's tolerance of                            previous anesthesia was also reviewed. The risks                            and benefits of the procedure and the sedation                            options and risks were discussed with the patient.                            All questions were answered, and informed consent                            was obtained. Prior Anticoagulants: The patient has                            taken no previous anticoagulant or antiplatelet                            agents. ASA Grade Assessment: II - A patient with                            mild systemic disease. After reviewing the risks                            and benefits, the patient was deemed in                            satisfactory condition to undergo the procedure.                           After obtaining informed consent, the endoscope was  passed under direct vision. Throughout the                            procedure, the patient's blood pressure, pulse, and                            oxygen saturations were monitored continuously. The                            Model GIF-HQ190 [id number]) scope was introduced                            through the mouth, and advanced to the second part                            of  duodenum. The upper GI endoscopy was                            accomplished without difficulty. The patient                            tolerated the procedure well. Scope In: Scope Out: Findings:                 The esophagus was normal.                           Minimal inflammation characterized by erythema and                            friability was found in the gastric antrum.                            Biopsies were taken with a cold forceps for                            histology.                           The examined duodenum was normal. Complications:            No immediate complications. Estimated blood loss:                            None. Estimated Blood Loss:     Estimated blood loss: none. Impression:               - Normal esophagus.                           - Mild gastritis. Biopsied.                           - Normal examined duodenum. Recommendation:           - Patient has a contact number available for  emergencies. The signs and symptoms of potential                            delayed complications were discussed with the                            patient. Return to normal activities tomorrow.                            Written discharge instructions were provided to the                            patient.                           - Resume previous diet.                           - Continue present medications.                           - Await pathology results. If negative for H.                            pylori, will likely continue workup with abdominal                            ultraound (checking for gallstones). Milus Banister, MD 07/29/2016 2:46:53 PM This report has been signed electronically.

## 2016-07-29 NOTE — Patient Instructions (Signed)
YOU HAD AN ENDOSCOPIC PROCEDURE TODAY AT THE [facility]:   Refer to the procedure report that was given to you for any specific questions about what was found during the examination.  If the procedure report does not answer your questions, please call your gastroenterologist to clarify.  If you requested that your care partner not be given the details of your procedure findings, then the procedure report has been included in a sealed envelope for you to review at your convenience later.  YOU SHOULD EXPECT: Some feelings of bloating in the abdomen. Passage of more gas than usual.  Walking can help get rid of the air that was put into your GI tract during the procedure and reduce the bloating. If you had a lower endoscopy (such as a colonoscopy or flexible sigmoidoscopy) you may notice spotting of blood in your stool or on the toilet paper. If you underwent a bowel prep for your procedure, you may not have a normal bowel movement for a few days.  Please Note:  You might notice some irritation and congestion in your nose or some drainage.  This is from the oxygen used during your procedure.  There is no need for concern and it should clear up in a day or so.  SYMPTOMS TO REPORT IMMEDIATELY:     Following upper endoscopy (EGD)  Vomiting of blood or coffee ground material  New chest pain or pain under the shoulder blades  Painful or persistently difficult swallowing  New shortness of breath  Fever of 100F or higher  Black, tarry-looking stools  For urgent or emergent issues, a gastroenterologist can be reached at any hour by calling [phone].   DIET:  We do recommend a small meal at first, but then you may proceed to your regular diet.  Drink plenty of fluids but you should avoid alcoholic beverages for 24 hours.  ACTIVITY:  You should plan to take it easy for the rest of today and you should NOT DRIVE or use heavy machinery until tomorrow (because of the sedation medicines  used during the test).    FOLLOW UP: Our staff will call the number listed on your records the next business day following your procedure to check on you and address any questions or concerns that you may have regarding the information given to you following your procedure. If we do not reach you, we will leave a message.  However, if you are feeling well and you are not experiencing any problems, there is no need to return our call.  We will assume that you have returned to your regular daily activities without incident.  If any biopsies were taken you will be contacted by phone or by letter within the next 1-3 weeks.  Please call us at [phone] if you have not heard about the biopsies in 3 weeks.    SIGNATURES/CONFIDENTIALITY: You and/or your care partner have signed paperwork which will be entered into your electronic medical record.  These signatures attest to the fact that that the information above on your After Visit Summary has been reviewed and is understood.  Full responsibility of the confidentiality of this discharge information lies with you and/or your care-partner.   Resume medications. Information given on Gastritis. 

## 2016-07-30 ENCOUNTER — Telehealth: Payer: Self-pay | Admitting: *Deleted

## 2016-07-30 NOTE — Telephone Encounter (Signed)
Phone not ringing.

## 2016-07-30 NOTE — Telephone Encounter (Signed)
  Follow up Call-  Call back number 07/29/2016  Post procedure Call Back phone  # [phone]  Permission to leave phone message Yes  Some recent data might be hidden     Patient questions:  Unable to call out.  Second call.

## 2016-08-08 ENCOUNTER — Other Ambulatory Visit: Payer: Self-pay

## 2016-08-08 DIAGNOSIS — R1013 Epigastric pain: Secondary | ICD-10-CM

## 2016-08-08 NOTE — Progress Notes (Signed)
You have been scheduled for an abdominal ultrasound at [facility] Radiology (1st floor of hospital) on 08/12/16 at 930  am. Please arrive 15 minutes prior to your appointment for registration. Make certain not to have anything to eat or drink 6 hours prior to your appointment. Should you need to reschedule your appointment, please contact radiology at [phone]. This test typically takes about 30 minutes to perform.  The pt has been advised of the appt and instructed

## 2016-08-12 ENCOUNTER — Ambulatory Visit (HOSPITAL_COMMUNITY)
Admission: RE | Admit: 2016-08-12 | Discharge: 2016-08-12 | Disposition: A | Discharge status: Home / Self Care | Payer: Medicare Other | Source: Ambulatory Visit | Attending: Gastroenterology | Admitting: Gastroenterology

## 2016-08-12 DIAGNOSIS — R109 Unspecified abdominal pain: Secondary | ICD-10-CM | POA: Diagnosis not present

## 2016-08-12 DIAGNOSIS — R1013 Epigastric pain: Secondary | ICD-10-CM | POA: Diagnosis not present

## 2016-08-21 ENCOUNTER — Other Ambulatory Visit: Payer: Self-pay | Admitting: Physician Assistant

## 2016-08-21 DIAGNOSIS — K2990 Gastroduodenitis, unspecified, without bleeding: Secondary | ICD-10-CM

## 2016-09-09 ENCOUNTER — Other Ambulatory Visit (INDEPENDENT_AMBULATORY_CARE_PROVIDER_SITE_OTHER): Payer: Medicare Other

## 2016-09-09 ENCOUNTER — Ambulatory Visit (INDEPENDENT_AMBULATORY_CARE_PROVIDER_SITE_OTHER): Payer: Medicare Other | Admitting: Gastroenterology

## 2016-09-09 ENCOUNTER — Encounter: Payer: Self-pay | Admitting: Gastroenterology

## 2016-09-09 VITALS — BP 158/98 | HR 84 | Ht 65.0 in | Wt 219.0 lb

## 2016-09-09 DIAGNOSIS — R197 Diarrhea, unspecified: Secondary | ICD-10-CM | POA: Diagnosis not present

## 2016-09-09 DIAGNOSIS — R12 Heartburn: Secondary | ICD-10-CM

## 2016-09-09 DIAGNOSIS — R1013 Epigastric pain: Secondary | ICD-10-CM

## 2016-09-09 LAB — IGA: IgA: 192 mg/dL (ref 68–378)

## 2016-09-09 NOTE — Progress Notes (Signed)
Review of pertinent gastrointestinal problems: 1. GERD-like dyspepsia: Labs 04/2016 CBC, CMET normal. EGD Dr. Ardis Hughs 2018 found mild gastritis, h pylori negative. 07/2016 [place] was normal.   HPI: This is a very pleasant 41 year old [name] whom I last saw time of an upper endoscopy.  Chief complaint is persistent epigastric burning, also chronic diarrhea  Has burning with any food intake.  Has tried cutting out acidic foods.  Started in February, never before then.  Has pain, is not excruciating.   Has chronic loose stools.  If diarrhea is severe can see blood onTP.  Takes protonix 40mg  daily.  pepcid two pills at bedtime nightly.  No nsaids.   ROS: complete GI ROS as described in HPI, all other review negative.  Constitutional:  No unintentional weight loss   Past Medical History:  Diagnosis Date  . Adrenal insufficiency ([place])   . GERD (gastroesophageal reflux disease)   . History of chickenpox   . Hypertension   . Pituitary tumor   . Thyroid disease    Cushing's Disease    Past Surgical History:  Procedure Laterality Date  . gamma knife radiation  09/2003  . TRANSPHENOIDAL / TRANSNASAL HYPOPHYSECTOMY / RESECTION PITUITARY TUMOR  02/2003, 07/2003    Current Outpatient Prescriptions  Medication Sig Dispense Refill  . calcium carbonate (CALCIUM 600) 600 MG TABS tablet Take 600 mg by mouth daily with breakfast.    . Cholecalciferol (VITAMIN D) 2000 units CAPS Take 1 capsule by mouth daily.    . famotidine (PEPCID AC MAXIMUM STRENGTH) 20 MG tablet Take 40 mg by mouth at bedtime.    Marland Kitchen levothyroxine (SYNTHROID, LEVOTHROID) 125 MCG tablet Take 1 tablet by mouth daily.  5  . LOPREEZA 1-0.5 MG tablet Take 1 tablet by mouth daily.  0  . metoprolol succinate (TOPROL-XL) 25 MG 24 hr tablet Take 3 tablets by mouth daily.    . pantoprazole (PROTONIX) 40 MG tablet TAKE 1 TABLET(40 MG) BY MOUTH DAILY 90 tablet 0  . predniSONE (DELTASONE) 5 MG tablet Take 1 tablet by mouth daily.  5  .  Somatropin (NORDITROPIN) 5 MG/1.5ML SOLN Inject 1 mL into the skin at bedtime.    . sucralfate (CARAFATE) 1 g tablet Take 1 tablet (1 g total) by mouth 3 (three) times daily with meals. 60 tablet 0   Current Facility-Administered Medications  Medication Dose Route Frequency Provider Last Rate Last Dose  . 0.9 %  sodium chloride infusion  500 mL Intravenous Continuous Milus Banister, MD        Allergies as of 09/09/2016 - Review Complete 09/09/2016  Allergen Reaction Noted  . Erythromycin Nausea And Vomiting 11/01/2010  . Penicillins Rash 11/01/2010    Family History  Problem Relation Age of Onset  . Hypertension Paternal Aunt   . Hyperlipidemia Paternal Aunt   . Drug abuse Paternal Uncle   . Stroke Paternal Uncle   . Heart attack Paternal Uncle   . Hypertension Maternal Grandfather   . Hyperlipidemia Maternal Grandfather   . Heart attack Maternal Grandfather   . Prostate cancer Maternal Grandfather   . Hyperlipidemia Paternal Grandmother   . Hypertension Paternal Grandmother   . Dementia Paternal Grandmother   . Hyperlipidemia Paternal Grandfather   . Hypertension Paternal Grandfather   . COPD Paternal Grandfather   . Diabetes Paternal Grandfather   . Heart attack Paternal Grandfather   . Breast cancer Paternal Aunt   . Colon cancer Neg Hx   . Stomach cancer Neg Hx   .  Esophageal cancer Neg Hx     Social History   Social History  . Marital status: Single    Spouse name: N/A  . Number of children: N/A  . Years of education: N/A   Occupational History  . Not on file.   Social History Main Topics  . Smoking status: Never Smoker  . Smokeless tobacco: Never Used  . Alcohol use No  . Drug use: No  . Sexual activity: Yes    Birth control/ protection: Condom   Other Topics Concern  . Not on file   Social History Narrative  . No narrative on file     Physical Exam: BP (!) 158/98   Pulse 84   Ht 5\' 5"  (1.651 m)   Wt 219 lb (99.3 kg)   BMI 36.Julie kg/m   Constitutional: generally well-appearing Psychiatric: alert and oriented x3 Abdomen: soft, nontender, nondistended, no obvious ascites, no peritoneal signs, normal bowel sounds No peripheral edema noted in lower extremities  Assessment and plan: 41 y.o. female with Persistent epigastric burning, chronic loose stools  She is of [place] heritage and because of that and her symptoms are going to test her for celiac sprue with serologies. Her symptoms of burning are more classic for GERD and at the celiac testing is negative then I would likely increase her antiacid control to proton pump inhibitor twice daily. I think if that were the case also I would like to proceed with colonoscopy since she has chronic diarrhea that is not explained.  Please see the "Patient Instructions" section for addition details about the plan.  Owens Loffler, MD [facility] 09/09/2016, 1:58 PM

## 2016-09-09 NOTE — Patient Instructions (Addendum)
You will have labs checked today in the basement lab.  Please head down after you check out with the front desk  (IgA, tTG).  If negative then will increase your antiacids (to protonix twice daily) and see how you respond.  Normal BMI (Body Mass Index- based on height and weight) is between 19 and 25. Your BMI today is Body mass index is 36.44 kg/m. Marland Kitchen Please consider follow up  regarding your BMI with your Primary Care Provider.

## 2016-09-10 LAB — TISSUE TRANSGLUTAMINASE, IGA: Tissue Transglutaminase Ab, IgA: 1 U/mL (ref <4)

## 2016-09-16 ENCOUNTER — Other Ambulatory Visit: Payer: Self-pay

## 2016-09-16 DIAGNOSIS — K2990 Gastroduodenitis, unspecified, without bleeding: Secondary | ICD-10-CM

## 2016-09-16 MED ORDER — PANTOPRAZOLE SODIUM 40 MG PO TBEC: 40.0000 mg | DELAYED_RELEASE_TABLET | Freq: Two times a day (BID) | ORAL | 3 refills | Status: DC

## 2016-10-06 ENCOUNTER — Ambulatory Visit (AMBULATORY_SURGERY_CENTER): Payer: Self-pay

## 2016-10-06 VITALS — Ht 64.5 in | Wt 222.2 lb

## 2016-10-06 DIAGNOSIS — R197 Diarrhea, unspecified: Secondary | ICD-10-CM

## 2016-10-06 MED ORDER — SUPREP BOWEL PREP KIT 17.5-3.13-1.6 GM/177ML PO SOLN: 1.0000 | Freq: Once | ORAL | 0 refills | Status: AC

## 2016-10-06 NOTE — Progress Notes (Signed)
No allergies allergies to eggs or soy No past problems with anesthesia No diet meds No home oxygen  Registered emmi

## 2016-10-07 ENCOUNTER — Encounter: Payer: Self-pay | Admitting: Gastroenterology

## 2016-10-09 ENCOUNTER — Telehealth: Payer: Self-pay | Admitting: Gastroenterology

## 2016-10-09 DIAGNOSIS — J3089 Other allergic rhinitis: Secondary | ICD-10-CM | POA: Diagnosis not present

## 2016-10-09 DIAGNOSIS — R7301 Impaired fasting glucose: Secondary | ICD-10-CM | POA: Diagnosis not present

## 2016-10-09 DIAGNOSIS — R21 Rash and other nonspecific skin eruption: Secondary | ICD-10-CM | POA: Diagnosis not present

## 2016-10-09 DIAGNOSIS — E038 Other specified hypothyroidism: Secondary | ICD-10-CM | POA: Diagnosis not present

## 2016-10-09 DIAGNOSIS — E559 Vitamin D deficiency, unspecified: Secondary | ICD-10-CM | POA: Diagnosis not present

## 2016-10-09 DIAGNOSIS — E230 Hypopituitarism: Secondary | ICD-10-CM | POA: Diagnosis not present

## 2016-10-09 NOTE — Telephone Encounter (Signed)
Patient states she just spoke with her endocrinologist and they want her to have her pain medication the day of her procedure. Pt would like a call to discuss.

## 2016-10-09 NOTE — Telephone Encounter (Signed)
Pt states her endocrinologist, Dr. Chalmers Cater, states she needs to have Hydrocortisone 50mg  either IM or IV 19min prior to her procedure on Monday. Pt states she needs this due to adrenal insufficiency. Pt wants to know if she can get this here in the [facility] or if she needs to try to go by Dr. Almetta Lovely office prior to the procedure. Please advise.

## 2016-10-10 NOTE — Telephone Encounter (Signed)
Thanks for letting us know. 

## 2016-10-10 NOTE — Telephone Encounter (Signed)
I spoke with Dr. Chalmers Cater this morning. She does advise stress dose steroids, hydrocortisone half an hour to an hour prior to her colonoscopy. Her office is close to the lower endoscopy Center and so we are going to advise the patient to stop by her endocrinologist office at 2:30, get her IM shot, and then proceed directly to the lower endoscopy Center for her colonoscopy which is scheduled to start at 3:30.  Patty can you please advise [facility] on the above. She needs to show up at her endocrinologist office around 2:15 to get her shot at 2:30 and then come directly to the [facility]. She should otherwise follow her telemetry see prep instructions as written. Can you please also give a heads up to Almedia Balls that [place] might arrive more like 2:45 or 3:00 for her 3:30 colonoscopy on Monday.

## 2016-10-10 NOTE — Telephone Encounter (Signed)
The pt has been advised of the recommendation, she agrees and [facility] notified.  Forwarded to [place]

## 2016-10-13 ENCOUNTER — Encounter: Payer: Self-pay | Admitting: Gastroenterology

## 2016-10-13 ENCOUNTER — Ambulatory Visit (AMBULATORY_SURGERY_CENTER): Payer: Medicare Other | Admitting: Gastroenterology

## 2016-10-13 VITALS — BP 155/84 | HR 90 | Temp 98.4°F | Resp 21 | Ht 65.0 in | Wt 219.0 lb

## 2016-10-13 DIAGNOSIS — R197 Diarrhea, unspecified: Secondary | ICD-10-CM

## 2016-10-13 DIAGNOSIS — D128 Benign neoplasm of rectum: Secondary | ICD-10-CM

## 2016-10-13 DIAGNOSIS — K529 Noninfective gastroenteritis and colitis, unspecified: Secondary | ICD-10-CM

## 2016-10-13 DIAGNOSIS — I10 Essential (primary) hypertension: Secondary | ICD-10-CM | POA: Diagnosis not present

## 2016-10-13 DIAGNOSIS — K621 Rectal polyp: Secondary | ICD-10-CM | POA: Diagnosis not present

## 2016-10-13 DIAGNOSIS — K219 Gastro-esophageal reflux disease without esophagitis: Secondary | ICD-10-CM | POA: Diagnosis not present

## 2016-10-13 DIAGNOSIS — E230 Hypopituitarism: Secondary | ICD-10-CM | POA: Diagnosis not present

## 2016-10-13 DIAGNOSIS — D129 Benign neoplasm of anus and anal canal: Secondary | ICD-10-CM

## 2016-10-13 MED ORDER — SODIUM CHLORIDE 0.9 % IV SOLN: 500.0000 mL | INTRAVENOUS | Status: DC

## 2016-10-13 NOTE — Progress Notes (Signed)
Report to PACU, RN, vss, BBS= Clear.  

## 2016-10-13 NOTE — Patient Instructions (Signed)
YOU HAD AN ENDOSCOPIC PROCEDURE TODAY AT THE [facility]:   Refer to the procedure report that was given to you for any specific questions about what was found during the examination.  If the procedure report does not answer your questions, please call your gastroenterologist to clarify.  If you requested that your care partner not be given the details of your procedure findings, then the procedure report has been included in a sealed envelope for you to review at your convenience later.  YOU SHOULD EXPECT: Some feelings of bloating in the abdomen. Passage of more gas than usual.  Walking can help get rid of the air that was put into your GI tract during the procedure and reduce the bloating. If you had a lower endoscopy (such as a colonoscopy or flexible sigmoidoscopy) you may notice spotting of blood in your stool or on the toilet paper. If you underwent a bowel prep for your procedure, you may not have a normal bowel movement for a few days.  Please Note:  You might notice some irritation and congestion in your nose or some drainage.  This is from the oxygen used during your procedure.  There is no need for concern and it should clear up in a day or so.  SYMPTOMS TO REPORT IMMEDIATELY:   Following lower endoscopy (colonoscopy or flexible sigmoidoscopy):  Excessive amounts of blood in the stool  Significant tenderness or worsening of abdominal pains  Swelling of the abdomen that is new, acute  Fever of 100F or higher    For urgent or emergent issues, a gastroenterologist can be reached at any hour by calling [phone].   DIET:  We do recommend a small meal at first, but then you may proceed to your regular diet.  Drink plenty of fluids but you should avoid alcoholic beverages for 24 hours.  ACTIVITY:  You should plan to take it easy for the rest of today and you should NOT DRIVE or use heavy machinery until tomorrow (because of the sedation medicines used during the test).     FOLLOW UP: Our staff will call the number listed on your records the next business day following your procedure to check on you and address any questions or concerns that you may have regarding the information given to you following your procedure. If we do not reach you, we will leave a message.  However, if you are feeling well and you are not experiencing any problems, there is no need to return our call.  We will assume that you have returned to your regular daily activities without incident.  If any biopsies were taken you will be contacted by phone or by letter within the next 1-3 weeks.  Please call us at [phone] if you have not heard about the biopsies in 3 weeks.    SIGNATURES/CONFIDENTIALITY: You and/or your care partner have signed paperwork which will be entered into your electronic medical record.  These signatures attest to the fact that that the information above on your After Visit Summary has been reviewed and is understood.  Full responsibility of the confidentiality of this discharge information lies with you and/or your care-partner.   Resume medications. Information given on polyps. 

## 2016-10-13 NOTE — Op Note (Signed)
[facility] Patient Name: [name] Procedure Date: 10/13/2016 3:28 PM MRN: [id number] Endoscopist: Milus Banister , MD Age: 41 Referring MD:  Date of Birth: Nov 04, 1975 Gender: Female Account #: [id number] Procedure:                Colonoscopy Indications:              Epigastric abdominal pain, Chronic diarrhea Medicines:                Monitored Anesthesia Care Procedure:                Pre-Anesthesia Assessment:                           - Prior to the procedure, a History and Physical                            was performed, and patient medications and                            allergies were reviewed. The patient's tolerance of                            previous anesthesia was also reviewed. The risks                            and benefits of the procedure and the sedation                            options and risks were discussed with the patient.                            All questions were answered, and informed consent                            was obtained. Prior Anticoagulants: The patient has                            taken no previous anticoagulant or antiplatelet                            agents. ASA Grade Assessment: II - A patient with                            mild systemic disease. After reviewing the risks                            and benefits, the patient was deemed in                            satisfactory condition to undergo the procedure.                           After obtaining informed consent, the colonoscope  was passed under direct vision. Throughout the                            procedure, the patient's blood pressure, pulse, and                            oxygen saturations were monitored continuously. The                            Colonoscope was introduced through the anus and                            advanced to the the terminal ileum. The colonoscopy                            was  performed without difficulty. The patient                            tolerated the procedure well. The quality of the                            bowel preparation was excellent. The ileocecal                            valve, appendiceal orifice, and rectum were                            photographed. Scope In: 3:37:02 PM Scope Out: 1:95:09 PM Scope Withdrawal Time: 0 hours 8 minutes 25 seconds  Total Procedure Duration: 0 hours 9 minutes 39 seconds  Findings:                 The terminal ileum appeared normal.                           There was a short segment of circumferential                            inflammation at the level of the hepatic flexure                            (mild). This was about 3-4cm long. There were also                            a few punctate erosions in the left colon. Biospies                            taken (jar 2).                           Random biopsies were taken from the remainder of                            the colon (jar 1).  A 5 mm polyp was found in the rectum. The polyp was                            sessile. The polyp was removed with a cold snare.                            Resection and retrieval were complete (jar 3).                           The exam was otherwise without abnormality on                            direct and retroflexion views. Complications:            No immediate complications. Estimated blood loss:                            None. Estimated Blood Loss:     Estimated blood loss: none. Impression:               - The examined portion of the ileum was normal.                           - Mild inflammation at the hepatic flexure and a                            few small erosions in the left colon (biopsied to                            check for chronic inflammation)                           - Random biopsies taken from normal appearing                            mucoca (biopsied to check  for microscope colitis).                           - One 5 mm polyp in the rectum, removed with a cold                            snare. Resected and retrieved.                           - The examination was otherwise normal on direct                            and retroflexion views. Recommendation:           - Patient has a contact number available for                            emergencies. The signs and symptoms of potential  delayed complications were discussed with the                            patient. Return to normal activities tomorrow.                            Written discharge instructions were provided to the                            patient.                           - Resume previous diet.                           - Continue present medications.                           - Await pathology results.                           - Repeat colonoscopy at age 8 for screening                            purposes. Milus Banister, MD 10/13/2016 3:53:38 PM This report has been signed electronically.

## 2016-10-13 NOTE — Progress Notes (Signed)
Called to room to assist during endoscopic procedure.  Patient ID and intended procedure confirmed with present staff. Received instructions for my participation in the procedure from the performing physician.  

## 2016-10-14 ENCOUNTER — Telehealth: Payer: Self-pay

## 2016-10-14 NOTE — Telephone Encounter (Signed)
  Follow up Call-  Call back number 10/13/2016 07/29/2016  Post procedure Call Back phone  # [phone]  Permission to leave phone message Yes Yes  Some recent data might be hidden     Patient questions:  Do you have a fever, pain , or abdominal swelling? No. Pain Score  0 *  Have you tolerated food without any problems? Yes.    Have you been able to return to your normal activities? Yes.    Do you have any questions about your discharge instructions: Diet   No. Medications  No. Follow up visit  No.  Do you have questions or concerns about your Care? No.  Actions: * If pain score is 4 or above: No action needed, pain <4.

## 2016-10-16 DIAGNOSIS — E240 Pituitary-dependent Cushing's disease: Secondary | ICD-10-CM | POA: Diagnosis not present

## 2016-10-16 DIAGNOSIS — E230 Hypopituitarism: Secondary | ICD-10-CM | POA: Diagnosis not present

## 2016-10-16 DIAGNOSIS — E2749 Other adrenocortical insufficiency: Secondary | ICD-10-CM | POA: Diagnosis not present

## 2016-10-16 DIAGNOSIS — I10 Essential (primary) hypertension: Secondary | ICD-10-CM | POA: Diagnosis not present

## 2016-10-21 ENCOUNTER — Other Ambulatory Visit: Payer: Self-pay

## 2016-10-21 MED ORDER — MESALAMINE 1.2 G PO TBEC: 2.4000 g | DELAYED_RELEASE_TABLET | Freq: Every day | ORAL | 5 refills | Status: DC

## 2016-11-04 DIAGNOSIS — H5213 Myopia, bilateral: Secondary | ICD-10-CM | POA: Diagnosis not present

## 2016-11-27 DIAGNOSIS — E038 Other specified hypothyroidism: Secondary | ICD-10-CM | POA: Diagnosis not present

## 2016-11-27 DIAGNOSIS — E230 Hypopituitarism: Secondary | ICD-10-CM | POA: Diagnosis not present

## 2016-12-29 ENCOUNTER — Ambulatory Visit: Payer: Medicare Other | Admitting: Gastroenterology

## 2016-12-29 ENCOUNTER — Encounter: Payer: Self-pay | Admitting: Gastroenterology

## 2016-12-29 VITALS — BP 144/98 | HR 84 | Ht 65.0 in | Wt 222.0 lb

## 2016-12-29 DIAGNOSIS — R197 Diarrhea, unspecified: Secondary | ICD-10-CM

## 2016-12-29 MED ORDER — MESALAMINE 1.2 G PO TBEC: 4.8000 g | DELAYED_RELEASE_TABLET | Freq: Every day | ORAL | 4 refills | Status: DC

## 2016-12-29 NOTE — Progress Notes (Signed)
Review of pertinent gastrointestinal problems: 1. GERD-like dyspepsia: Labs 04/2016 CBC, CMET normal. EGD Dr. Ardis Hughs 2018 found mild gastritis, h pylori negative. 07/2016 [place] was normal.  2. Chronic loose stools 08/2016: tTG, total IgA were normal.  Colonoscopy showed a normal terminal ileum, patchy area of mild circumferential inflammation at her hepatic flexure, a single polyp.  The polyp was hyperplastic, random colon biopsies were all normal, the area of patchy inflammation in her hepatic flexure was mild acute inflammation on pathology.  I started her on Lialda 1.2 g 2 pills once daily.  HPI: This is a very pleasant 41 year old woman whom I last saw the time of colonoscopy, see those results summarized above.  She started taking the oral mesalamine and noticed some improvement for the first month but then her symptoms returned with intermittent diarrhea episodes that could last a day or 2 associated with some crampy pains.  She has had no overt GI bleeding.  No fevers or chills  Chief complaint is diarrhea  ROS: complete GI ROS as described in HPI, all other review negative.  Constitutional:  No unintentional weight loss   Past Medical History:  Diagnosis Date  . Adrenal insufficiency ([place])   . GERD (gastroesophageal reflux disease)   . History of chickenpox   . Hypertension   . Pituitary tumor   . Thyroid disease    Cushing's Disease    Past Surgical History:  Procedure Laterality Date  . gamma knife radiation  09/2003  . TRANSPHENOIDAL / TRANSNASAL HYPOPHYSECTOMY / RESECTION PITUITARY TUMOR  02/2003, 07/2003    Current Outpatient Medications  Medication Sig Dispense Refill  . calcium carbonate (CALCIUM 600) 600 MG TABS tablet Take 600 mg by mouth daily with breakfast.    . Cholecalciferol (VITAMIN D) 2000 units CAPS Take 1 capsule by mouth daily.    . famotidine (PEPCID AC MAXIMUM STRENGTH) 20 MG tablet Take 40 mg by mouth at bedtime.    . hydrocortisone (CORTEF) 10 MG tablet  Take 10 mg by mouth 2 (two) times daily.    Marland Kitchen levothyroxine (SYNTHROID, LEVOTHROID) 125 MCG tablet Take 1 tablet by mouth daily.  5  . LOPREEZA 1-0.5 MG tablet Take 1 tablet by mouth daily.  0  . mesalamine (LIALDA) 1.2 g EC tablet Take 2 tablets (2.4 g total) by mouth daily with breakfast. 60 tablet 5  . metoprolol succinate (TOPROL-XL) 25 MG 24 hr tablet Take 3 tablets by mouth daily.    . Somatropin (NORDITROPIN) 5 MG/1.5ML SOLN Inject 1 mL into the skin at bedtime.    . pantoprazole (PROTONIX) 40 MG tablet Take 1 tablet (40 mg total) by mouth 2 (two) times daily. 180 tablet 3   Current Facility-Administered Medications  Medication Dose Route Frequency Provider Last Rate Last Dose  . 0.9 %  sodium chloride infusion  500 mL Intravenous Continuous Milus Banister, MD        Allergies as of 12/29/2016 - Review Complete 12/29/2016  Allergen Reaction Noted  . Erythromycin Nausea And Vomiting 11/01/2010  . Penicillins Rash 11/01/2010    Family History  Problem Relation Age of Onset  . Hypertension Paternal Aunt   . Hyperlipidemia Paternal Aunt   . Drug abuse Paternal Uncle   . Stroke Paternal Uncle   . Heart attack Paternal Uncle   . Hypertension Maternal Grandfather   . Hyperlipidemia Maternal Grandfather   . Heart attack Maternal Grandfather   . Prostate cancer Maternal Grandfather   . Hyperlipidemia Paternal Grandmother   .  Hypertension Paternal Grandmother   . Dementia Paternal Grandmother   . Hyperlipidemia Paternal Grandfather   . Hypertension Paternal Grandfather   . COPD Paternal Grandfather   . Diabetes Paternal Grandfather   . Heart attack Paternal Grandfather   . Breast cancer Paternal Aunt   . Colon cancer Neg Hx   . Stomach cancer Neg Hx   . Esophageal cancer Neg Hx     Social History   Socioeconomic History  . Marital status: Single    Spouse name: Not on file  . Number of children: Not on file  . Years of education: Not on file  . Highest education  level: Not on file  Social Needs  . Financial resource strain: Not on file  . Food insecurity - worry: Not on file  . Food insecurity - inability: Not on file  . Transportation needs - medical: Not on file  . Transportation needs - non-medical: Not on file  Occupational History  . Not on file  Tobacco Use  . Smoking status: Never Smoker  . Smokeless tobacco: Never Used  Substance and Sexual Activity  . Alcohol use: No  . Drug use: No  . Sexual activity: Yes    Birth control/protection: Condom  Other Topics Concern  . Not on file  Social History Narrative  . Not on file     Physical Exam: Ht 5\' 5"  (1.651 m)   Wt 222 lb (100.7 kg)   BMI 36.94 kg/m  Constitutional: generally well-appearing Psychiatric: alert and oriented x3 Abdomen: soft, nontender, nondistended, no obvious ascites, no peritoneal signs, normal bowel sounds No peripheral edema noted in lower extremities  Assessment and plan: 41 y.o. female with intermittent diarrhea episodes, acute inflammation in colon   I am not convinced that she has underlying inflammatory bowel disease.  The inflammation which I saw during her colonoscopy was mild, only 3-4 cm long and is circumferential.  Biopsies showed acute inflammation only.  Perhaps this was related to the prep.   possibly ischemia however the biopsies do not support that.  Certainly on no chronic inflammation.  I tried her on Sable Feil at 2 pills once daily and she did seem to improve for about a month.  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  She does point to a traumatic acute diarrheal illness several months ago that seemed to be the beginning of a lot of her troubles.  She had some sick relatives around  that time as well.  Please see the "Patient Instructions" section for addition details about the plan.  Owens Loffler, MD [facility] 12/29/2016, 3:34 PM

## 2016-12-29 NOTE — Patient Instructions (Addendum)
We have sent the following medications to your pharmacy for you to pick up at your convenience: Lialda  Follow up on 03/03/17 at 1:45 pm

## 2017-03-03 ENCOUNTER — Encounter: Payer: Self-pay | Admitting: Gastroenterology

## 2017-03-03 ENCOUNTER — Ambulatory Visit: Payer: Medicare Other | Admitting: Gastroenterology

## 2017-03-03 VITALS — BP 158/88 | HR 88 | Ht 65.0 in | Wt 227.0 lb

## 2017-03-03 DIAGNOSIS — R197 Diarrhea, unspecified: Secondary | ICD-10-CM

## 2017-03-03 NOTE — Patient Instructions (Addendum)
Please return to see Dr. Ardis Hughs in 3-4 months.  No changes in your meds.  Normal BMI (Body Mass Index- based on height and weight) is between 19 and 25. Your BMI today is Body mass index is 37.77 kg/m. Marland Kitchen Please consider follow up  regarding your BMI with your Primary Care Provider.

## 2017-03-03 NOTE — Progress Notes (Signed)
Review of pertinent gastrointestinal problems: 1.GERD-like dyspepsia:Labs 04/2016 CBC, CMET normal. EGD Dr. Ardis Hughs 2018 found mild gastritis, h pylori negative. 07/2016 [place] was normal.  2. Chronic loose stools 08/2016: tTG, total IgA were normal.  Colonoscopy showed a normal terminal ileum, patchy area of mild circumferential inflammation at her hepatic flexure, a single polyp.  The polyp was hyperplastic, random colon biopsies were all normal, the area of patchy inflammation in her hepatic flexure was mild acute inflammation on pathology.  I started her on Lialda 1.2 g 2 pills once daily. Increased to lialda 4 pills daily (trial)  HPI: This is a very pleasant 42 yo woman whom I last saw about 2 months ago  At that time I increased her oral mesalamine to full strength, 4 pills once daily.  Since then her diarrhe much improved.  Mostly normal stools.  Not waking with epig discomfort but usually after she eats she will feel the discomfort.  Sometimes at night she general discomfort, around dinner time, evening.    Not waking up with pains.  Sleeping with a wedge.   Chief complaint is dyspepsia, loose stools  ROS: complete GI ROS as described in HPI, all other review negative.  Constitutional:  No unintentional weight loss   Past Medical History:  Diagnosis Date  . Adrenal insufficiency ([place])   . GERD (gastroesophageal reflux disease)   . History of chickenpox   . Hypertension   . Pituitary tumor   . Thyroid disease    Cushing's Disease    Past Surgical History:  Procedure Laterality Date  . gamma knife radiation  09/2003  . TRANSPHENOIDAL / TRANSNASAL HYPOPHYSECTOMY / RESECTION PITUITARY TUMOR  02/2003, 07/2003    Current Outpatient Medications  Medication Sig Dispense Refill  . calcium carbonate (CALCIUM 600) 600 MG TABS tablet Take 600 mg by mouth daily with breakfast.    . Cholecalciferol (VITAMIN D) 2000 units CAPS Take 1 capsule by mouth daily.    . famotidine (PEPCID AC  MAXIMUM STRENGTH) 20 MG tablet Take 40 mg by mouth at bedtime.    . hydrocortisone (CORTEF) 10 MG tablet Take 10 mg by mouth 2 (two) times daily.    Marland Kitchen levothyroxine (SYNTHROID, LEVOTHROID) 112 MCG tablet Take 1 tablet by mouth daily.  5  . LOPREEZA 1-0.5 MG tablet Take 1 tablet by mouth daily.  0  . mesalamine (LIALDA) 1.2 g EC tablet Take 4 tablets (4.8 g total) by mouth daily with breakfast. 120 tablet 4  . metoprolol succinate (TOPROL-XL) 25 MG 24 hr tablet Take 3 tablets by mouth daily.    . Somatropin (NORDITROPIN) 5 MG/1.5ML SOLN Inject 1 mL into the skin at bedtime.    . pantoprazole (PROTONIX) 40 MG tablet Take 1 tablet (40 mg total) by mouth 2 (two) times daily. 180 tablet 3   Current Facility-Administered Medications  Medication Dose Route Frequency Provider Last Rate Last Dose  . 0.9 %  sodium chloride infusion  500 mL Intravenous Continuous Milus Banister, MD        Allergies as of 03/03/2017 - Review Complete 03/03/2017  Allergen Reaction Noted  . Erythromycin Nausea And Vomiting 11/01/2010  . Penicillins Rash 11/01/2010    Family History  Problem Relation Age of Onset  . Hypertension Paternal Aunt   . Hyperlipidemia Paternal Aunt   . Drug abuse Paternal Uncle   . Stroke Paternal Uncle   . Heart attack Paternal Uncle   . Hypertension Maternal Grandfather   . Hyperlipidemia Maternal Grandfather   .  Heart attack Maternal Grandfather   . Prostate cancer Maternal Grandfather   . Hyperlipidemia Paternal Grandmother   . Hypertension Paternal Grandmother   . Dementia Paternal Grandmother   . Hyperlipidemia Paternal Grandfather   . Hypertension Paternal Grandfather   . COPD Paternal Grandfather   . Diabetes Paternal Grandfather   . Heart attack Paternal Grandfather   . Breast cancer Paternal Aunt   . Colon cancer Neg Hx   . Stomach cancer Neg Hx   . Esophageal cancer Neg Hx     Social History   Socioeconomic History  . Marital status: Single    Spouse name: Not  on file  . Number of children: Not on file  . Years of education: Not on file  . Highest education level: Not on file  Social Needs  . Financial resource strain: Not on file  . Food insecurity - worry: Not on file  . Food insecurity - inability: Not on file  . Transportation needs - medical: Not on file  . Transportation needs - non-medical: Not on file  Occupational History  . Not on file  Tobacco Use  . Smoking status: Never Smoker  . Smokeless tobacco: Never Used  Substance and Sexual Activity  . Alcohol use: No  . Drug use: No  . Sexual activity: Yes    Birth control/protection: Condom  Other Topics Concern  . Not on file  Social History Narrative  . Not on file     Physical Exam: BP (!) 158/88   Pulse 88   Ht 5\' 5"  (1.651 m)   Wt 227 lb (103 kg)   BMI 37.77 kg/m  Constitutional: generally well-appearing Psychiatric: alert and oriented x3 Abdomen: soft, nontender, nondistended, no obvious ascites, no peritoneal signs, normal bowel sounds No peripheral edema noted in lower extremities  Assessment and plan: 42 y.o. female with chronic loose stools, dyspepsia  I am not convinced that she has inflammatory bowel disease given the colonoscopy, looking her terminal ileum and pathology results however she does seem to be improving.  Perhaps the oral mesalamine is playing a positive role here.  She still has some mild dyspeptic symptoms.  Recommending no changes in her medicines for now.  She will return in 3 or 4 months.  She is continuing to gradually improve I will probably decrease her oral mesalamine over time.  Please see the "Patient Instructions" section for addition details about the plan.  Owens Loffler, MD [facility] 03/03/2017, 1:55 PM

## 2017-05-29 ENCOUNTER — Other Ambulatory Visit: Payer: Self-pay | Admitting: Gastroenterology

## 2017-07-01 ENCOUNTER — Other Ambulatory Visit: Payer: Self-pay | Admitting: Gastroenterology

## 2017-09-29 ENCOUNTER — Other Ambulatory Visit: Payer: Self-pay | Admitting: Gastroenterology

## 2017-09-29 DIAGNOSIS — K2990 Gastroduodenitis, unspecified, without bleeding: Secondary | ICD-10-CM

## 2017-10-29 ENCOUNTER — Other Ambulatory Visit: Payer: Self-pay | Admitting: Gastroenterology

## 2017-11-02 ENCOUNTER — Encounter: Payer: Self-pay | Admitting: Gastroenterology

## 2017-11-02 ENCOUNTER — Ambulatory Visit: Payer: Medicare Other | Admitting: Gastroenterology

## 2017-11-02 VITALS — BP 126/82 | HR 84 | Ht 65.0 in | Wt 248.6 lb

## 2017-11-02 DIAGNOSIS — R197 Diarrhea, unspecified: Secondary | ICD-10-CM | POA: Diagnosis not present

## 2017-11-02 NOTE — Patient Instructions (Addendum)
Imodium trial, one pill every morning shortly after waking. Call in 5-6 weeks to report on your response. Please return to see Dr. Ardis Hughs in one year  Thank you for entrusting me with your care and choosing [facility].  Dr Ardis Hughs .

## 2017-11-02 NOTE — Progress Notes (Signed)
Review of pertinent gastrointestinal problems: 1.GERD-like dyspepsia:Labs 04/2016 CBC, CMET normal. EGD Dr. Ardis Hughs 2018 found mild gastritis, h pylori negative. 07/2016 [place] was normal. 2. Chronic loose stools 08/2016: tTG, total IgA were normal.Colonoscopy showed a normal terminal ileum, patchy area of mildcircumferentialinflammation at her hepatic flexure, a single polyp. The polyp was hyperplastic, random colon biopsies were all normal, the area of patchy inflammation in her hepatic flexure was mild acute inflammation on pathology. I started her on Lialda 1.2 g 2 pills once daily. Increased to lialda 4 pills daily (trial) with good response.    HPI: This is a very pleasant 42 year old woman whom I last saw several months ago  Chief complaint is chronic loose stools   Has gained 21 pounds since last visit 8 months ago here (same scale)  She says her bowels are overall much better but she still tends to have soft, somewhat loose stools but no acute formed.  Once or twice a day.  About once or twice a week she will have some urgency and diarrhea as well.  She is taking Lialda 4 pills once daily. she never sees bleeding.  ROS: complete GI ROS as described in HPI, all other review negative.  Constitutional:  No unintentional weight loss   Past Medical History:  Diagnosis Date  . Adrenal insufficiency ([place])   . GERD (gastroesophageal reflux disease)   . History of chickenpox   . Hypertension   . Pituitary tumor   . Thyroid disease    Cushing's Disease    Past Surgical History:  Procedure Laterality Date  . EYE SURGERY Bilateral    x 2 as a child  . gamma knife radiation  09/2003  . TRANSPHENOIDAL / TRANSNASAL HYPOPHYSECTOMY / RESECTION PITUITARY TUMOR  02/2003, 07/2003    Current Outpatient Medications  Medication Sig Dispense Refill  . calcium carbonate (CALCIUM 600) 600 MG TABS tablet Take 600 mg by mouth daily with breakfast.    . Cholecalciferol (VITAMIN D) 2000 units  CAPS Take 1 capsule by mouth daily.    . famotidine (PEPCID AC MAXIMUM STRENGTH) 20 MG tablet Take 40 mg by mouth at bedtime.    . hydrocortisone (CORTEF) 10 MG tablet Take 10 mg by mouth 2 (two) times daily.    Marland Kitchen levothyroxine (SYNTHROID, LEVOTHROID) 112 MCG tablet Take 1 tablet by mouth daily.  5  . LOPREEZA 1-0.5 MG tablet Take 1 tablet by mouth daily.  0  . mesalamine (LIALDA) 1.2 g EC tablet TAKE 4 TABLETS(4.8 GRAMS) BY MOUTH DAILY WITH BREAKFAST 120 tablet 0  . metoprolol succinate (TOPROL-XL) 25 MG 24 hr tablet Take 3 tablets by mouth daily.    . pantoprazole (PROTONIX) 40 MG tablet TAKE 1 TABLET(40 MG) BY MOUTH TWICE DAILY 180 tablet 0  . Somatropin (NORDITROPIN) 5 MG/1.5ML SOLN Inject 1 mL into the skin at bedtime.     No current facility-administered medications for this visit.     Allergies as of 11/02/2017 - Review Complete 11/02/2017  Allergen Reaction Noted  . Erythromycin Nausea And Vomiting 11/01/2010  . Penicillins Rash 11/01/2010    Family History  Problem Relation Age of Onset  . Hypertension Paternal Aunt   . Hyperlipidemia Paternal Aunt   . Drug abuse Paternal Uncle   . Stroke Paternal Uncle   . Heart attack Paternal Uncle   . Hypertension Maternal Grandfather   . Hyperlipidemia Maternal Grandfather   . Heart attack Maternal Grandfather   . Prostate cancer Maternal Grandfather   . Hyperlipidemia  Paternal Grandmother   . Hypertension Paternal Grandmother   . Dementia Paternal Grandmother   . Hyperlipidemia Paternal Grandfather   . Hypertension Paternal Grandfather   . COPD Paternal Grandfather   . Diabetes Paternal Grandfather   . Heart attack Paternal Grandfather   . Breast cancer Paternal Aunt   . Colon cancer Neg Hx   . Stomach cancer Neg Hx   . Esophageal cancer Neg Hx     Social History   Socioeconomic History  . Marital status: Single    Spouse name: Not on file  . Number of children: Not on file  . Years of education: Not on file  .  Highest education level: Not on file  Occupational History  . Not on file  Social Needs  . Financial resource strain: Not on file  . Food insecurity:    Worry: Not on file    Inability: Not on file  . Transportation needs:    Medical: Not on file    Non-medical: Not on file  Tobacco Use  . Smoking status: Never Smoker  . Smokeless tobacco: Never Used  Substance and Sexual Activity  . Alcohol use: No  . Drug use: No  . Sexual activity: Yes    Birth control/protection: Condom  Lifestyle  . Physical activity:    Days per week: Not on file    Minutes per session: Not on file  . Stress: Not on file  Relationships  . Social connections:    Talks on phone: Not on file    Gets together: Not on file    Attends religious service: Not on file    Active member of club or organization: Not on file    Attends meetings of clubs or organizations: Not on file    Relationship status: Not on file  . Intimate partner violence:    Fear of current or ex partner: Not on file    Emotionally abused: Not on file    Physically abused: Not on file    Forced sexual activity: Not on file  Other Topics Concern  . Not on file  Social History Narrative  . Not on file     Physical Exam: Ht 5\' 5"  (1.651 m)   Wt 248 lb 9.6 oz (112.8 kg)   BMI 41.37 kg/m  Constitutional: generally well-appearing Psychiatric: alert and oriented x3 Abdomen: soft, nontender, nondistended, no obvious ascites, no peritoneal signs, normal bowel sounds No peripheral edema noted in lower extremities  Assessment and plan: 42 y.o. female with chronic loose stools  Lialda 4 pills once daily seems to be helping.  She did have some mild acute inflammation in her colon.  Perhaps she has developed some mild inflammatory bowel disease.  Overall she is much better on the medicine.  She is not yet normal and so I recommended she try single Imodium over-the-counter 1 pill once daily shortly after waking every morning.  She will  call to report her response in 5 or 6 weeks and she will return for follow-up appointment in 1 year.  Please see the "Patient Instructions" section for addition details about the plan.  Owens Loffler, MD [facility] 11/02/2017, 3:50 PM

## 2017-11-10 DIAGNOSIS — R7301 Impaired fasting glucose: Secondary | ICD-10-CM | POA: Diagnosis not present

## 2017-11-10 DIAGNOSIS — E038 Other specified hypothyroidism: Secondary | ICD-10-CM | POA: Diagnosis not present

## 2017-11-10 DIAGNOSIS — E230 Hypopituitarism: Secondary | ICD-10-CM | POA: Diagnosis not present

## 2017-11-17 DIAGNOSIS — E240 Pituitary-dependent Cushing's disease: Secondary | ICD-10-CM | POA: Diagnosis not present

## 2017-11-17 DIAGNOSIS — I10 Essential (primary) hypertension: Secondary | ICD-10-CM | POA: Diagnosis not present

## 2017-11-17 DIAGNOSIS — E230 Hypopituitarism: Secondary | ICD-10-CM | POA: Diagnosis not present

## 2017-11-17 DIAGNOSIS — E2749 Other adrenocortical insufficiency: Secondary | ICD-10-CM | POA: Diagnosis not present

## 2017-11-23 DIAGNOSIS — Z13820 Encounter for screening for osteoporosis: Secondary | ICD-10-CM | POA: Diagnosis not present

## 2017-11-28 ENCOUNTER — Other Ambulatory Visit: Payer: Self-pay | Admitting: Gastroenterology

## 2017-12-28 ENCOUNTER — Other Ambulatory Visit: Payer: Self-pay | Admitting: Gastroenterology

## 2018-01-04 ENCOUNTER — Other Ambulatory Visit: Payer: Self-pay | Admitting: Gastroenterology

## 2018-01-04 DIAGNOSIS — K2990 Gastroduodenitis, unspecified, without bleeding: Secondary | ICD-10-CM

## 2018-03-02 DIAGNOSIS — H52223 Regular astigmatism, bilateral: Secondary | ICD-10-CM | POA: Diagnosis not present

## 2018-03-21 DIAGNOSIS — S93401A Sprain of unspecified ligament of right ankle, initial encounter: Secondary | ICD-10-CM | POA: Diagnosis not present

## 2018-03-28 ENCOUNTER — Other Ambulatory Visit: Payer: Self-pay | Admitting: Gastroenterology

## 2018-03-28 DIAGNOSIS — K2990 Gastroduodenitis, unspecified, without bleeding: Secondary | ICD-10-CM

## 2018-03-29 ENCOUNTER — Other Ambulatory Visit: Payer: Self-pay | Admitting: Gastroenterology

## 2018-05-31 DIAGNOSIS — N939 Abnormal uterine and vaginal bleeding, unspecified: Secondary | ICD-10-CM | POA: Diagnosis not present

## 2018-05-31 DIAGNOSIS — Z793 Long term (current) use of hormonal contraceptives: Secondary | ICD-10-CM | POA: Diagnosis not present

## 2018-06-02 DIAGNOSIS — N939 Abnormal uterine and vaginal bleeding, unspecified: Secondary | ICD-10-CM | POA: Diagnosis not present

## 2018-06-27 ENCOUNTER — Other Ambulatory Visit: Payer: Self-pay | Admitting: Gastroenterology

## 2018-06-27 DIAGNOSIS — K2990 Gastroduodenitis, unspecified, without bleeding: Secondary | ICD-10-CM

## 2018-07-01 ENCOUNTER — Other Ambulatory Visit: Payer: Self-pay | Admitting: Gastroenterology

## 2018-07-01 DIAGNOSIS — K2990 Gastroduodenitis, unspecified, without bleeding: Secondary | ICD-10-CM

## 2018-07-13 ENCOUNTER — Other Ambulatory Visit (HOSPITAL_COMMUNITY)
Admission: RE | Admit: 2018-07-13 | Discharge: 2018-07-13 | Disposition: A | Discharge status: Home / Self Care | Payer: Medicare Other | Source: Ambulatory Visit | Attending: Obstetrics and Gynecology | Admitting: Obstetrics and Gynecology

## 2018-07-13 ENCOUNTER — Other Ambulatory Visit: Payer: Self-pay | Admitting: Obstetrics and Gynecology

## 2018-07-13 DIAGNOSIS — Z01419 Encounter for gynecological examination (general) (routine) without abnormal findings: Secondary | ICD-10-CM | POA: Insufficient documentation

## 2018-07-13 DIAGNOSIS — B379 Candidiasis, unspecified: Secondary | ICD-10-CM | POA: Diagnosis not present

## 2018-07-13 DIAGNOSIS — N951 Menopausal and female climacteric states: Secondary | ICD-10-CM | POA: Diagnosis not present

## 2018-07-15 LAB — CYTOLOGY - PAP
Diagnosis: UNDETERMINED — AB
HPV: NOT DETECTED

## 2018-08-03 ENCOUNTER — Other Ambulatory Visit: Payer: Self-pay | Admitting: Obstetrics and Gynecology

## 2018-08-03 DIAGNOSIS — Z1231 Encounter for screening mammogram for malignant neoplasm of breast: Secondary | ICD-10-CM

## 2018-09-17 ENCOUNTER — Ambulatory Visit: Payer: Medicare Other

## 2018-09-29 ENCOUNTER — Other Ambulatory Visit: Payer: Self-pay | Admitting: Gastroenterology

## 2018-09-29 DIAGNOSIS — K2990 Gastroduodenitis, unspecified, without bleeding: Secondary | ICD-10-CM

## 2018-10-12 DIAGNOSIS — E038 Other specified hypothyroidism: Secondary | ICD-10-CM | POA: Diagnosis not present

## 2018-10-12 DIAGNOSIS — E2749 Other adrenocortical insufficiency: Secondary | ICD-10-CM | POA: Diagnosis not present

## 2018-10-12 DIAGNOSIS — R7301 Impaired fasting glucose: Secondary | ICD-10-CM | POA: Diagnosis not present

## 2018-10-12 DIAGNOSIS — E230 Hypopituitarism: Secondary | ICD-10-CM | POA: Diagnosis not present

## 2018-10-29 ENCOUNTER — Other Ambulatory Visit: Payer: Self-pay

## 2018-10-29 ENCOUNTER — Ambulatory Visit
Admission: RE | Admit: 2018-10-29 | Discharge: 2018-10-29 | Disposition: A | Discharge status: Home / Self Care | Payer: Medicare Other | Source: Ambulatory Visit | Attending: Obstetrics and Gynecology | Admitting: Obstetrics and Gynecology

## 2018-10-29 DIAGNOSIS — Z1231 Encounter for screening mammogram for malignant neoplasm of breast: Secondary | ICD-10-CM

## 2018-11-01 DIAGNOSIS — E230 Hypopituitarism: Secondary | ICD-10-CM | POA: Diagnosis not present

## 2018-11-01 DIAGNOSIS — E240 Pituitary-dependent Cushing's disease: Secondary | ICD-10-CM | POA: Diagnosis not present

## 2018-11-01 DIAGNOSIS — E2749 Other adrenocortical insufficiency: Secondary | ICD-10-CM | POA: Diagnosis not present

## 2018-11-01 DIAGNOSIS — I10 Essential (primary) hypertension: Secondary | ICD-10-CM | POA: Diagnosis not present

## 2018-11-02 ENCOUNTER — Other Ambulatory Visit: Payer: Self-pay | Admitting: Obstetrics and Gynecology

## 2018-11-02 DIAGNOSIS — R928 Other abnormal and inconclusive findings on diagnostic imaging of breast: Secondary | ICD-10-CM

## 2018-11-05 ENCOUNTER — Other Ambulatory Visit: Payer: Self-pay | Admitting: Obstetrics and Gynecology

## 2018-11-05 ENCOUNTER — Ambulatory Visit
Admission: RE | Admit: 2018-11-05 | Discharge: 2018-11-05 | Disposition: A | Discharge status: Home / Self Care | Payer: Medicare Other | Source: Ambulatory Visit | Attending: Obstetrics and Gynecology | Admitting: Obstetrics and Gynecology

## 2018-11-05 ENCOUNTER — Other Ambulatory Visit: Payer: Self-pay

## 2018-11-05 DIAGNOSIS — N6489 Other specified disorders of breast: Secondary | ICD-10-CM

## 2018-11-05 DIAGNOSIS — R928 Other abnormal and inconclusive findings on diagnostic imaging of breast: Secondary | ICD-10-CM | POA: Diagnosis not present

## 2018-11-23 ENCOUNTER — Telehealth: Payer: Self-pay | Admitting: Physician Assistant

## 2018-11-23 NOTE — Telephone Encounter (Signed)
LM asking pt to call back to let us know if she still plans on using [facility] as her PCP and if so to please schedule a CPE appt with him. ELEA

## 2019-03-11 ENCOUNTER — Ambulatory Visit (INDEPENDENT_AMBULATORY_CARE_PROVIDER_SITE_OTHER): Payer: Medicare Other | Admitting: Gastroenterology

## 2019-03-11 ENCOUNTER — Encounter: Payer: Self-pay | Admitting: Gastroenterology

## 2019-03-11 VITALS — BP 132/88 | HR 99 | Temp 98.0°F | Ht 65.0 in | Wt 260.0 lb

## 2019-03-11 DIAGNOSIS — R197 Diarrhea, unspecified: Secondary | ICD-10-CM | POA: Diagnosis not present

## 2019-03-11 MED ORDER — FAMOTIDINE 40 MG PO TABS: 40.0000 mg | ORAL_TABLET | Freq: Every day | ORAL | 11 refills | Status: DC

## 2019-03-11 NOTE — Patient Instructions (Addendum)
If you are age 42 or older, your body mass index should be between 23-30. Your Body mass index is 43.27 kg/m. If this is out of the aforementioned range listed, please consider follow up with your Primary Care Provider.  If you are age 97 or younger, your body mass index should be between 19-25. Your Body mass index is 43.27 kg/m. If this is out of the aformentioned range listed, please consider follow up with your Primary Care Provider  STOP: dinner dose of Protonix CONTINUE: breakfast dose of Protonix  We have sent the following medications to your pharmacy for you to pick up at your convenience:  START: famotidine 40mg  take one tablet at bedtime each night  Please call in 1 month to give an update on how you are feeling.   Thank you,  Dr Ardis Hughs

## 2019-03-11 NOTE — Progress Notes (Signed)
Review of pertinent gastrointestinal problems: 1.GERD-like dyspepsia:Labs 04/2016 CBC, CMET normal. EGD Dr. Ardis Hughs 2018 found mild gastritis, h pylori negative. 07/2016 [place] was normal. 2. Chronic loose stools 08/2016: tTG, total IgA were normal.Colonoscopy 01/2016 showed a normal terminal ileum, patchy area of mildcircumferentialinflammation at her hepatic flexure, a single polyp. The polyp was hyperplastic, random colon biopsies were all normal, the area of patchy inflammation in her hepatic flexure was mild acute inflammation on pathology. I started her on Lialda 1.2 g 2 pills once daily.Increased to lialda 4 pills daily (trial) with good response.  October 2019 added single Imodium every morning.  HPI: This is a very pleasant 44 year old woman whom I last saw little over a year ago.  She is still on Lialda 4 pills once daily.  She says she is clearly better than she was before she started the Lialda but her bowels are not back to completely normal.  She can have bad days with 4 or 5 very loose stools those days.  Most days however she has 1-2 fairly solid stools.  No bleeding.  She tried taking Imodium in addition to the [place] but had a lot of cramping side effects.   Her weight is up 12 pounds since she was last here in our office in October 2019, same scale  ROS: complete GI ROS as described in HPI, all other review negative.  Constitutional:  No unintentional weight loss   Past Medical History:  Diagnosis Date  . Adrenal insufficiency ([place])   . GERD (gastroesophageal reflux disease)   . History of chickenpox   . Hypertension   . Pituitary tumor   . Thyroid disease    Cushing's Disease    Past Surgical History:  Procedure Laterality Date  . EYE SURGERY Bilateral    x 2 as a child  . gamma knife radiation  09/2003  . TRANSPHENOIDAL / TRANSNASAL HYPOPHYSECTOMY / RESECTION PITUITARY TUMOR  02/2003, 07/2003    Current Outpatient Medications  Medication Sig Dispense Refill   . calcium carbonate (CALCIUM 600) 600 MG TABS tablet Take 600 mg by mouth daily with breakfast.    . Cholecalciferol (VITAMIN D) 2000 units CAPS Take 1 capsule by mouth daily.    . famotidine (PEPCID AC MAXIMUM STRENGTH) 20 MG tablet Take 40 mg by mouth at bedtime.    . hydrocortisone (CORTEF) 10 MG tablet Take 10 mg by mouth 2 (two) times daily.    Marland Kitchen levothyroxine (SYNTHROID, LEVOTHROID) 112 MCG tablet Take 1 tablet by mouth daily.  5  . LOPREEZA 1-0.5 MG tablet Take 1 tablet by mouth daily.  0  . mesalamine (LIALDA) 1.2 g EC tablet TAKE 4 TABLETS BY MOUTH DAILY WITH BREAKFAST 360 tablet 3  . metoprolol succinate (TOPROL-XL) 25 MG 24 hr tablet Take 3 tablets by mouth daily.    . pantoprazole (PROTONIX) 40 MG tablet TAKE 1 TABLET(40 MG) BY MOUTH TWICE DAILY 180 tablet 0  . Somatropin (NORDITROPIN) 5 MG/1.5ML SOLN Inject 1 mL into the skin at bedtime.     No current facility-administered medications for this visit.    Allergies as of 03/11/2019 - Review Complete 03/11/2019  Allergen Reaction Noted  . Erythromycin Nausea And Vomiting 11/01/2010  . Penicillins Rash 11/01/2010    Family History  Problem Relation Age of Onset  . Hypertension Paternal Aunt   . Hyperlipidemia Paternal Aunt   . Drug abuse Paternal Uncle   . Stroke Paternal Uncle   . Heart attack Paternal Uncle   .  Hypertension Maternal Grandfather   . Hyperlipidemia Maternal Grandfather   . Heart attack Maternal Grandfather   . Prostate cancer Maternal Grandfather   . Hyperlipidemia Paternal Grandmother   . Hypertension Paternal Grandmother   . Dementia Paternal Grandmother   . Hyperlipidemia Paternal Grandfather   . Hypertension Paternal Grandfather   . COPD Paternal Grandfather   . Diabetes Paternal Grandfather   . Heart attack Paternal Grandfather   . Breast cancer Paternal Aunt   . Colon cancer Neg Hx   . Stomach cancer Neg Hx   . Esophageal cancer Neg Hx   . Pancreatic cancer Neg Hx     Social History    Socioeconomic History  . Marital status: Single    Spouse name: Not on file  . Number of children: Not on file  . Years of education: Not on file  . Highest education level: Not on file  Occupational History  . Not on file  Tobacco Use  . Smoking status: Never Smoker  . Smokeless tobacco: Never Used  Substance and Sexual Activity  . Alcohol use: No  . Drug use: No  . Sexual activity: Yes    Birth control/protection: Condom  Other Topics Concern  . Not on file  Social History Narrative  . Not on file   Social Determinants of Health   Financial Resource Strain:   . Difficulty of Paying Living Expenses: Not on file  Food Insecurity:   . Worried About Charity fundraiser in the Last Year: Not on file  . Ran Out of Food in the Last Year: Not on file  Transportation Needs:   . Lack of Transportation (Medical): Not on file  . Lack of Transportation (Non-Medical): Not on file  Physical Activity:   . Days of Exercise per Week: Not on file  . Minutes of Exercise per Session: Not on file  Stress:   . Feeling of Stress : Not on file  Social Connections:   . Frequency of Communication with Friends and Family: Not on file  . Frequency of Social Gatherings with Friends and Family: Not on file  . Attends Religious Services: Not on file  . Active Member of Clubs or Organizations: Not on file  . Attends Archivist Meetings: Not on file  . Marital Status: Not on file  Intimate Partner Violence:   . Fear of Current or Ex-Partner: Not on file  . Emotionally Abused: Not on file  . Physically Abused: Not on file  . Sexually Abused: Not on file     Physical Exam: BP 132/88   Pulse 99   Temp 98 F (36.7 C)   Ht 5\' 5"  (1.651 m)   Wt 260 lb (117.9 kg)   BMI 43.27 kg/m  Constitutional: generally well-appearing Psychiatric: alert and oriented x3 Abdomen: soft, nontender, nondistended, no obvious ascites, no peritoneal signs, normal bowel sounds No peripheral edema  noted in lower extremities  Assessment and plan: 44 y.o. female with chronic loose stools, chronic GERD  She is clearly better than she was before she started the Lialda 4 pills once daily.  She is not really content with her bowel habits and is still bothered by intermittent loose stools however.  I wonder if she is having some side effect of proton pump inhibitor and I recommend she try to cut back to Protonix 1 pill once daily.  She will take her morning dose and not her dinner dose.  She will continue taking famotidine at bedtime.  I am giving her a new prescription for the 40 mg famotidine at bedtime.  She will call to report on her response to cutting back a proton pump inhibitor in about 1 month.  If she is still bothered I think it would try Levbid 1 pill twice daily on a scheduled basis to see if that helps.  She certainly might have some IBS overlap here.  Please see the "Patient Instructions" section for addition details about the plan.  Owens Loffler, MD [facility] 03/11/2019, 3:49 PM   Total time on date of encounter was 30 minutes (this included time spent preparing to see the patient reviewing records; obtaining and/or reviewing separately obtained history; performing a medically appropriate exam and/or evaluation; counseling and educating the patient and family if present; ordering medications, tests or procedures if applicable; and documenting clinical information in the health record).

## 2019-03-23 ENCOUNTER — Other Ambulatory Visit: Payer: Self-pay | Admitting: Gastroenterology

## 2019-04-01 IMAGING — US US ABDOMEN COMPLETE
1 series · 14 of 25 positions shown · non-contrast
Comparison: None.

CLINICAL DATA: Chronic abdominal pain

EXAM:
ABDOMEN ULTRASOUND COMPLETE

[Series 1: us abdomen complete · 0.24mm/px · 14 of 95 slices shown]
[im 1/95]
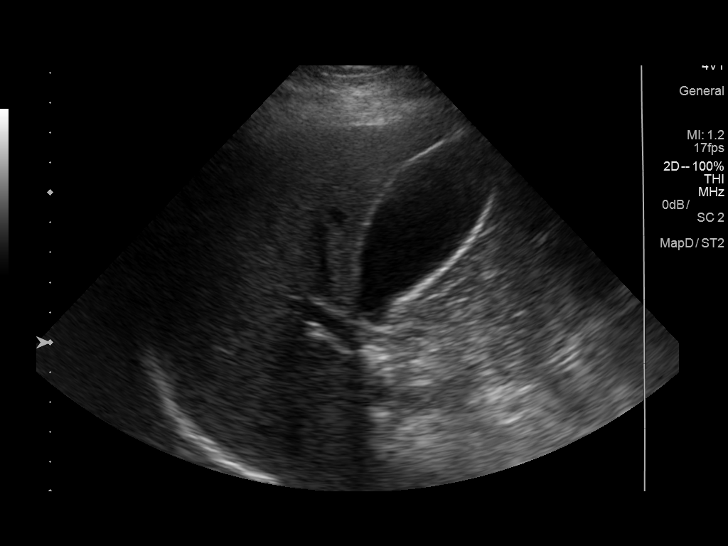
[im 8/95]
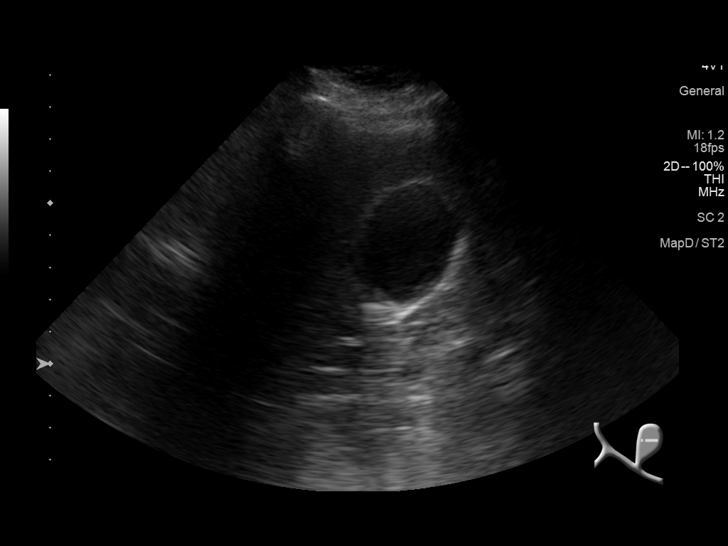
[im 16/95]
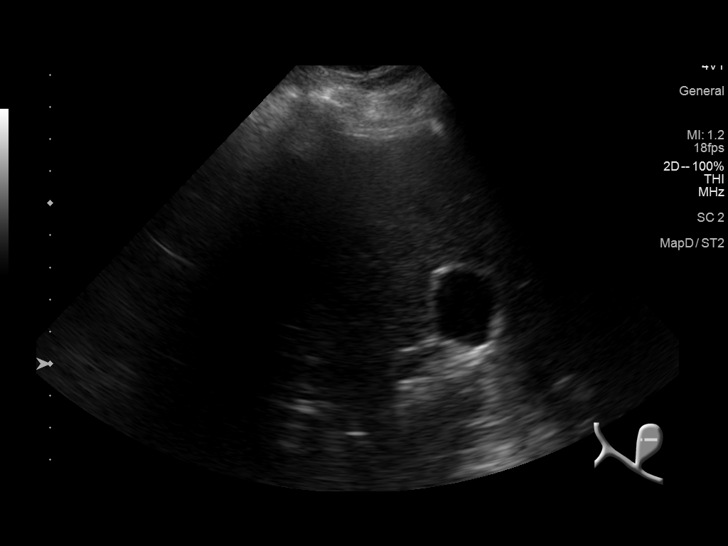
[im 24/95]
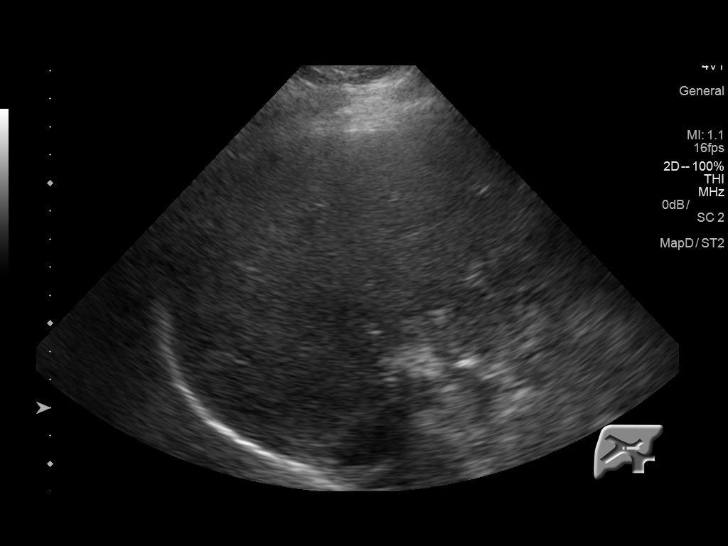
[im 32/95]
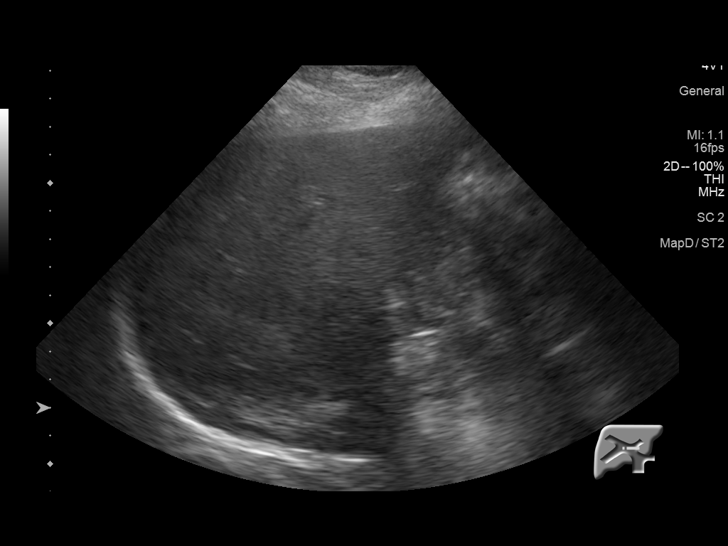
[im 36/95]
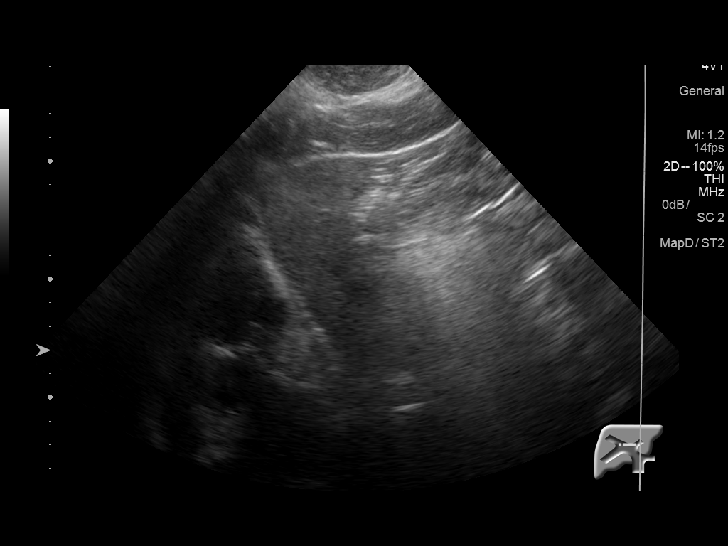
[im 44/95]
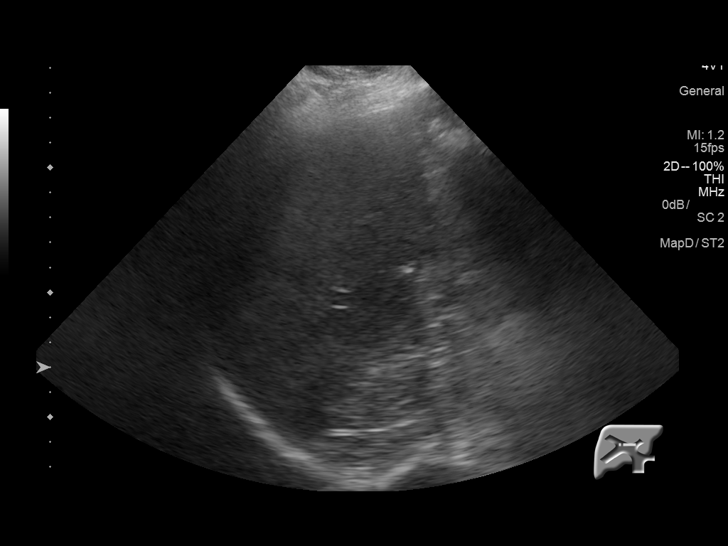
[im 51/95]
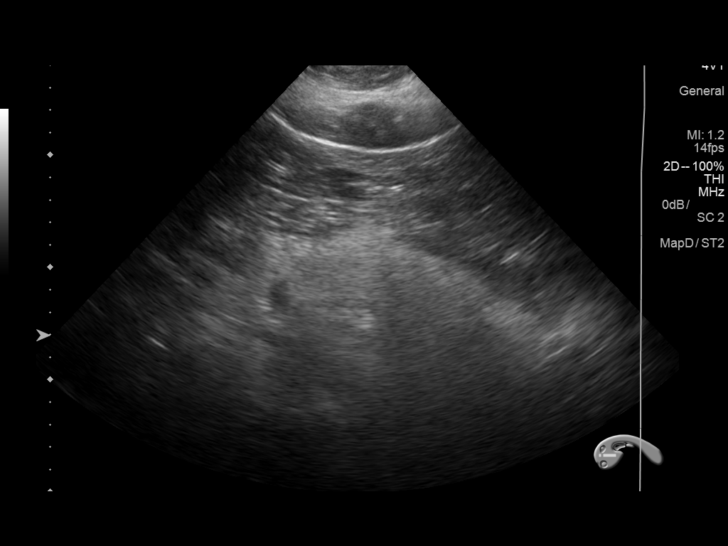
[im 59/95]
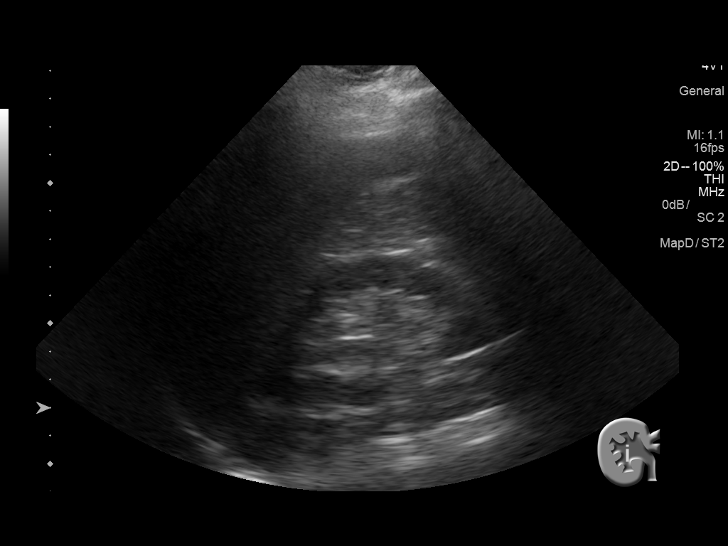
[im 63/95]
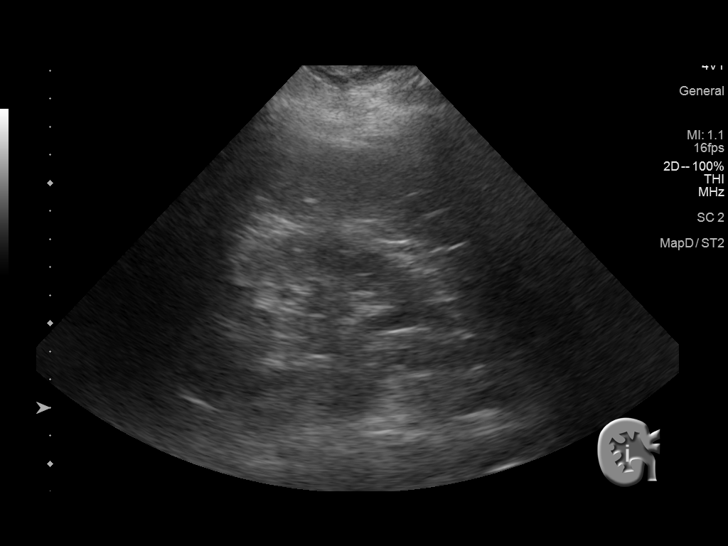
[im 71/95]
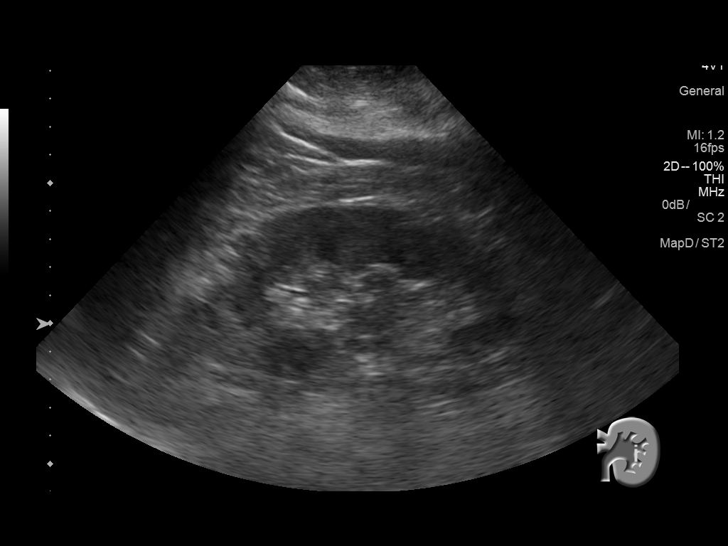
[im 79/95]
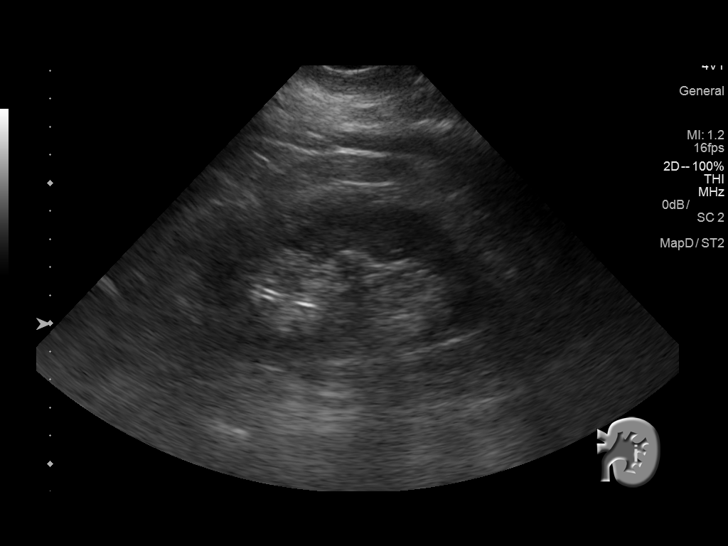
[im 87/95]
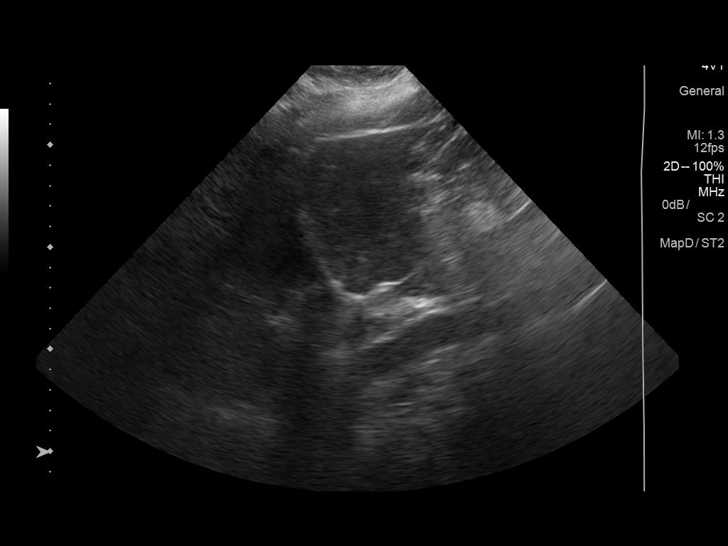
[im 95/95]
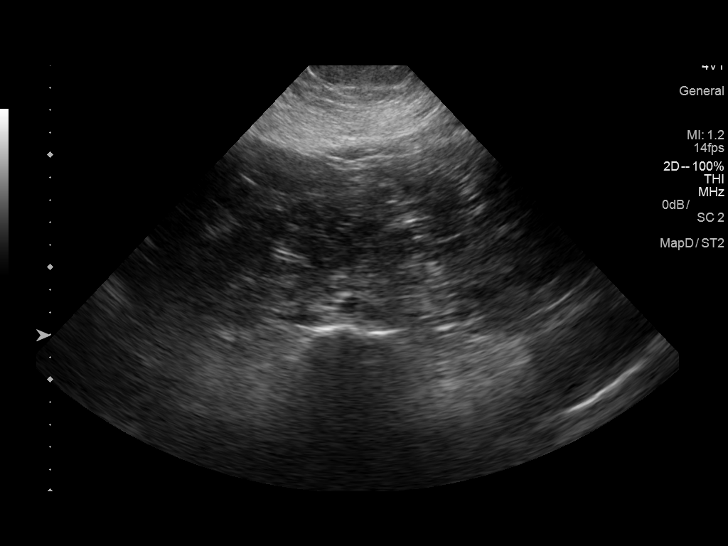

[14 of 25 positions shown; findings below may reference images not displayed]

FINDINGS: Gallbladder: No gallstones or wall thickening visualized. There is
no pericholecystic fluid. No sonographic Murphy sign noted by
sonographer.

Common bile duct: Diameter: 3 mm. No intrahepatic, common hepatic,
or common bile duct dilatation.

Liver: No focal lesion identified. Within normal limits in
parenchymal echogenicity.

IVC: No abnormality visualized.

Pancreas: Visualized portion unremarkable. Much of the pancreas is
obscured by gas.

Spleen: Size and appearance within normal limits.

Right Kidney: Length: 10.5 cm. Echogenicity within normal limits. No
mass or hydronephrosis visualized.

Left Kidney: Length: 10.9 cm. Echogenicity within normal limits. No
mass or hydronephrosis visualized.

Abdominal aorta: No aneurysm visualized.

Other findings: No demonstrable ascites.
IMPRESSION: Portions of pancreas obscured by gas. Visualized portions of
pancreas appear normal. Study otherwise unremarkable.

## 2019-04-12 ENCOUNTER — Other Ambulatory Visit: Payer: Self-pay

## 2019-04-12 DIAGNOSIS — K2990 Gastroduodenitis, unspecified, without bleeding: Secondary | ICD-10-CM

## 2019-04-12 MED ORDER — PANTOPRAZOLE SODIUM 40 MG PO TBEC: DELAYED_RELEASE_TABLET | ORAL | 3 refills | Status: DC

## 2019-05-09 ENCOUNTER — Ambulatory Visit
Admission: RE | Admit: 2019-05-09 | Discharge: 2019-05-09 | Disposition: A | Discharge status: Home / Self Care | Payer: Medicare Other | Source: Ambulatory Visit | Attending: Obstetrics and Gynecology | Admitting: Obstetrics and Gynecology

## 2019-05-09 ENCOUNTER — Other Ambulatory Visit: Payer: Self-pay | Admitting: Obstetrics and Gynecology

## 2019-05-09 ENCOUNTER — Other Ambulatory Visit: Payer: Self-pay

## 2019-05-09 DIAGNOSIS — N6489 Other specified disorders of breast: Secondary | ICD-10-CM

## 2019-12-19 ENCOUNTER — Other Ambulatory Visit: Payer: Self-pay

## 2019-12-19 ENCOUNTER — Ambulatory Visit
Admission: RE | Admit: 2019-12-19 | Discharge: 2019-12-19 | Disposition: A | Discharge status: Home / Self Care | Payer: Medicare Other | Source: Ambulatory Visit | Attending: Obstetrics and Gynecology | Admitting: Obstetrics and Gynecology

## 2019-12-19 DIAGNOSIS — N6489 Other specified disorders of breast: Secondary | ICD-10-CM

## 2019-12-23 ENCOUNTER — Other Ambulatory Visit: Payer: Self-pay | Admitting: Gastroenterology

## 2020-02-28 ENCOUNTER — Other Ambulatory Visit: Payer: Self-pay | Admitting: Gastroenterology

## 2020-03-23 ENCOUNTER — Other Ambulatory Visit: Payer: Self-pay | Admitting: Gastroenterology

## 2020-03-23 DIAGNOSIS — K2990 Gastroduodenitis, unspecified, without bleeding: Secondary | ICD-10-CM

## 2020-05-14 ENCOUNTER — Telehealth: Payer: Self-pay

## 2020-05-14 NOTE — Telephone Encounter (Signed)
LVM advising patient to call back and schedule TOC. 

## 2020-09-06 DIAGNOSIS — N951 Menopausal and female climacteric states: Secondary | ICD-10-CM | POA: Diagnosis not present

## 2020-09-06 DIAGNOSIS — E288 Other ovarian dysfunction: Secondary | ICD-10-CM | POA: Diagnosis not present

## 2020-09-06 DIAGNOSIS — Z01419 Encounter for gynecological examination (general) (routine) without abnormal findings: Secondary | ICD-10-CM | POA: Diagnosis not present

## 2020-11-02 DIAGNOSIS — E230 Hypopituitarism: Secondary | ICD-10-CM | POA: Diagnosis not present

## 2020-11-02 DIAGNOSIS — R7301 Impaired fasting glucose: Secondary | ICD-10-CM | POA: Diagnosis not present

## 2020-11-02 DIAGNOSIS — E2749 Other adrenocortical insufficiency: Secondary | ICD-10-CM | POA: Diagnosis not present

## 2020-11-02 DIAGNOSIS — E1165 Type 2 diabetes mellitus with hyperglycemia: Secondary | ICD-10-CM | POA: Diagnosis not present

## 2020-11-06 DIAGNOSIS — E240 Pituitary-dependent Cushing's disease: Secondary | ICD-10-CM | POA: Diagnosis not present

## 2020-11-06 DIAGNOSIS — E2749 Other adrenocortical insufficiency: Secondary | ICD-10-CM | POA: Diagnosis not present

## 2020-11-06 DIAGNOSIS — E230 Hypopituitarism: Secondary | ICD-10-CM | POA: Diagnosis not present

## 2020-11-06 DIAGNOSIS — I10 Essential (primary) hypertension: Secondary | ICD-10-CM | POA: Diagnosis not present

## 2021-01-03 DIAGNOSIS — E230 Hypopituitarism: Secondary | ICD-10-CM | POA: Diagnosis not present

## 2021-01-03 DIAGNOSIS — E2749 Other adrenocortical insufficiency: Secondary | ICD-10-CM | POA: Diagnosis not present

## 2021-01-03 DIAGNOSIS — E785 Hyperlipidemia, unspecified: Secondary | ICD-10-CM | POA: Diagnosis not present

## 2021-01-03 DIAGNOSIS — E1165 Type 2 diabetes mellitus with hyperglycemia: Secondary | ICD-10-CM | POA: Diagnosis not present

## 2021-02-11 ENCOUNTER — Encounter: Payer: Self-pay | Admitting: Gastroenterology

## 2021-02-12 ENCOUNTER — Other Ambulatory Visit: Payer: Self-pay

## 2021-02-12 MED ORDER — MESALAMINE 1.2 G PO TBEC: DELAYED_RELEASE_TABLET | ORAL | 1 refills | Status: DC

## 2021-02-25 ENCOUNTER — Other Ambulatory Visit: Payer: Self-pay | Admitting: Gastroenterology

## 2021-03-08 DIAGNOSIS — H04122 Dry eye syndrome of left lacrimal gland: Secondary | ICD-10-CM | POA: Diagnosis not present

## 2021-03-08 DIAGNOSIS — H04121 Dry eye syndrome of right lacrimal gland: Secondary | ICD-10-CM | POA: Diagnosis not present

## 2021-03-08 DIAGNOSIS — R7303 Prediabetes: Secondary | ICD-10-CM | POA: Diagnosis not present

## 2021-03-13 ENCOUNTER — Encounter: Payer: Self-pay | Admitting: Gastroenterology

## 2021-03-13 ENCOUNTER — Ambulatory Visit: Payer: Medicare Other | Admitting: Gastroenterology

## 2021-03-13 VITALS — BP 158/92 | HR 105 | Resp 97 | Ht 65.0 in | Wt 272.2 lb

## 2021-03-13 DIAGNOSIS — K529 Noninfective gastroenteritis and colitis, unspecified: Secondary | ICD-10-CM

## 2021-03-13 MED ORDER — MESALAMINE 1.2 G PO TBEC: 4.8000 g | DELAYED_RELEASE_TABLET | Freq: Every day | ORAL | 11 refills | Status: DC

## 2021-03-13 MED ORDER — DIPHENOXYLATE-ATROPINE 2.5-0.025 MG PO TABS: 1.0000 | ORAL_TABLET | Freq: Every day | ORAL | 11 refills | Status: AC

## 2021-03-13 MED ORDER — DIPHENOXYLATE-ATROPINE 2.5-0.025 MG PO TABS: 1.0000 | ORAL_TABLET | Freq: Every day | ORAL | 11 refills | Status: DC

## 2021-03-13 NOTE — Progress Notes (Signed)
Review of pertinent gastrointestinal problems: ?1. GERD-like dyspepsia: Labs 04/2016 CBC, CMET normal. EGD Dr. Ardis Hughs 2018 found mild gastritis, h pylori negative. 07/2016 [place] was normal.  ?2. Chronic loose stools 08/2016: tTG, total IgA were normal.  Colonoscopy 01/2016 showed a normal terminal ileum, patchy area of mild circumferential inflammation at her hepatic flexure, a single polyp.  The polyp was hyperplastic, random colon biopsies were all normal, the area of patchy inflammation in her hepatic flexure was mild acute inflammation on pathology.  I started her on Lialda 1.2 g 2 pills once daily. Increased to lialda 4 pills daily (trial) with good response.  October 2019 added single Imodium every morning. ? ? ?HPI: ?This is a very pleasant 46 year old woman ? ? ?I last saw her almost exactly 2 years ago here in the office.  She has gained 12 pounds since that visit.  She had clearly been doing better on Lialda 4 pills once daily. ? ?She does think that the Lialda 4 pills once daily has helped her chronic loose stools.  She has not tried coming off the medicine to test this but she is pretty clear that on the medicine since she has been taking it she has 2 bowel movements a day and before she started taking she was having 4-5 bowel movements a day.  Her BMs are still almost always unformed.  She does not have significant urgency and no bleeding.  She tried adding Imodium once daily but this caused a lot of cramping. ? ? ? ?ROS: complete GI ROS as described in HPI, all other review negative. ? ?Constitutional:  No unintentional weight loss ? ? ?Past Medical History:  ?Diagnosis Date  ? Adrenal insufficiency ([place])   ? GERD (gastroesophageal reflux disease)   ? History of chickenpox   ? Hypertension   ? Pituitary tumor   ? Thyroid disease   ? Cushing's Disease  ? ? ?Past Surgical History:  ?Procedure Laterality Date  ? EYE SURGERY Bilateral   ? x 2 as a child  ? gamma knife radiation  09/2003  ? TRANSPHENOIDAL /  TRANSNASAL HYPOPHYSECTOMY / RESECTION PITUITARY TUMOR  02/2003, 07/2003  ? ? ?Current Outpatient Medications  ?Medication Instructions  ? calcium carbonate (OS-CAL) 600 mg, Oral, Daily with breakfast  ? Cholecalciferol (VITAMIN D) 2000 units CAPS 1 capsule, Oral, Daily  ? famotidine (PEPCID) 40 MG tablet TAKE 1 TABLET(40 MG) BY MOUTH AT BEDTIME  ? hydrocortisone (CORTEF) 10 mg, Oral, 2 times daily  ? levothyroxine (SYNTHROID, LEVOTHROID) 112 MCG tablet 1 tablet, Oral, Daily  ? mesalamine (LIALDA) 1.2 g EC tablet TAKE 4 TABLETS BY MOUTH DAILY WITH BREAKFAST  ? metoprolol succinate (TOPROL-XL) 25 MG 24 hr tablet 3 tablets, Oral, Daily  ? pantoprazole (PROTONIX) 40 MG tablet TAKE 1 TABLET(40 MG) BY MOUTH TWICE DAILY  ? PREMPRO 0.625-5 MG tablet 1 tablet, Oral, Daily  ? Somatropin (NORDITROPIN) 5 MG/1.5ML SOLN 1 mL, Subcutaneous, Daily at bedtime  ? ? ?Allergies as of 03/13/2021 - Review Complete 03/11/2019  ?Allergen Reaction Noted  ? Erythromycin Nausea And Vomiting 11/01/2010  ? Penicillins Rash 11/01/2010  ? ? ?Family History  ?Problem Relation Age of Onset  ? Hypertension Paternal Aunt   ? Hyperlipidemia Paternal Aunt   ? Drug abuse Paternal Uncle   ? Stroke Paternal Uncle   ? Heart attack Paternal Uncle   ? Hypertension Maternal Grandfather   ? Hyperlipidemia Maternal Grandfather   ? Heart attack Maternal Grandfather   ? Prostate cancer Maternal  Grandfather   ? Hyperlipidemia Paternal Grandmother   ? Hypertension Paternal Grandmother   ? Dementia Paternal Grandmother   ? Hyperlipidemia Paternal Grandfather   ? Hypertension Paternal Grandfather   ? COPD Paternal Grandfather   ? Diabetes Paternal Grandfather   ? Heart attack Paternal Grandfather   ? Breast cancer Paternal Aunt   ? Colon cancer Neg Hx   ? Stomach cancer Neg Hx   ? Esophageal cancer Neg Hx   ? Pancreatic cancer Neg Hx   ? ? ?Social History  ? ?Socioeconomic History  ? Marital status: Single  ?  Spouse name: Not on file  ? Number of children: Not on  file  ? Years of education: Not on file  ? Highest education level: Not on file  ?Occupational History  ? Not on file  ?Tobacco Use  ? Smoking status: Never  ? Smokeless tobacco: Never  ?Vaping Use  ? Vaping Use: Never used  ?Substance and Sexual Activity  ? Alcohol use: No  ? Drug use: No  ? Sexual activity: Yes  ?  Birth control/protection: Condom  ?Other Topics Concern  ? Not on file  ?Social History Narrative  ? Not on file  ? ?Social Determinants of Health  ? ?Financial Resource Strain: Not on file  ?Food Insecurity: Not on file  ?Transportation Needs: Not on file  ?Physical Activity: Not on file  ?Stress: Not on file  ?Social Connections: Not on file  ?Intimate Partner Violence: Not on file  ? ? ? ?Physical Exam: ?BP (!) 158/92 (BP Location: Left Arm, Patient Position: Sitting, Cuff Size: Normal) Comment (BP Location): left forearm  Pulse (!) 105   Resp (!) 97   Ht 5\' 5"  (1.651 m)   Wt 272 lb 4 oz (123.5 kg)   BMI 45.30 kg/m?  ?Constitutional: generally well-appearing ?Psychiatric: alert and oriented x3 ?Abdomen: soft, nontender, nondistended, no obvious ascites, no peritoneal signs, normal bowel sounds ?No peripheral edema noted in lower extremities ? ?Assessment and plan: ?46 y.o. female with chronic loose stools ? ?She did have some mild inflammation on her colonoscopy and an empiric trial of Lialda has definitely helped.  She needs refills and I am happy to refill them at 4 pills once daily.  She has 2 stools daily and before the meal that she was having for 5 loose stools daily.  Her stools are still unformed now and I am going to try her on a single Lomotil pill once daily to see if that can for her up a bit more.  Previous trial of over-the-counter Imodium caused a lot of cramping side effects.  She knows I am happy to call in refills for her Lialda again in 1 year but if she still needs the prescription medicine in 2 years I would like to see her back in the office. ? ?Please see the "Patient  Instructions" section for addition details about the plan. ? ?Owens Loffler, MD ?[facility] ?03/13/2021, 2:43 PM ? ? ?Total time on date of encounter was 25 minutes (this included time spent preparing to see the patient reviewing records; obtaining and/or reviewing separately obtained history; performing a medically appropriate exam and/or evaluation; counseling and educating the patient and family if present; ordering medications, tests or procedures if applicable; and documenting clinical information in the health record). ? ?

## 2021-03-13 NOTE — Patient Instructions (Addendum)
If you are age 44 or younger, your body mass index should be between 19-25. Your Body mass index is 45.3 kg/m?Marland Kitchen If this is out of the aformentioned range listed, please consider follow up with your Primary Care Provider.  ?________________________________________________________ ? ?The [facility] providers would like to encourage you to use [facility] to communicate with providers for non-urgent requests or questions.  Due to long hold times on the telephone, sending your provider a message by [facility] may be a faster and more efficient way to get a response.  Please allow 48 business hours for a response.  Please remember that this is for non-urgent requests.  ?_______________________________________________________ ? ?We have sent the following medications to your pharmacy for you to pick up at your convenience: ? ?Lialda, Lomotil ? ?Thank you for entrusting me with your care and choosing [facility]. ? ?Dr Ardis Hughs ? ?

## 2021-03-22 ENCOUNTER — Other Ambulatory Visit: Payer: Self-pay | Admitting: Gastroenterology

## 2021-03-22 DIAGNOSIS — K2990 Gastroduodenitis, unspecified, without bleeding: Secondary | ICD-10-CM

## 2021-04-02 ENCOUNTER — Other Ambulatory Visit: Payer: Self-pay | Admitting: Obstetrics and Gynecology

## 2021-04-02 DIAGNOSIS — N6489 Other specified disorders of breast: Secondary | ICD-10-CM

## 2021-04-23 ENCOUNTER — Ambulatory Visit
Admission: RE | Admit: 2021-04-23 | Discharge: 2021-04-23 | Disposition: A | Discharge status: Home / Self Care | Payer: Medicare Other | Source: Ambulatory Visit | Attending: Obstetrics and Gynecology | Admitting: Obstetrics and Gynecology

## 2021-04-23 DIAGNOSIS — N6489 Other specified disorders of breast: Secondary | ICD-10-CM

## 2021-04-23 DIAGNOSIS — R922 Inconclusive mammogram: Secondary | ICD-10-CM | POA: Diagnosis not present

## 2021-05-07 DIAGNOSIS — E230 Hypopituitarism: Secondary | ICD-10-CM | POA: Diagnosis not present

## 2021-05-07 DIAGNOSIS — E1165 Type 2 diabetes mellitus with hyperglycemia: Secondary | ICD-10-CM | POA: Diagnosis not present

## 2021-05-07 DIAGNOSIS — E038 Other specified hypothyroidism: Secondary | ICD-10-CM | POA: Diagnosis not present

## 2021-05-07 DIAGNOSIS — E2749 Other adrenocortical insufficiency: Secondary | ICD-10-CM | POA: Diagnosis not present

## 2021-05-14 DIAGNOSIS — E240 Pituitary-dependent Cushing's disease: Secondary | ICD-10-CM | POA: Diagnosis not present

## 2021-05-14 DIAGNOSIS — E230 Hypopituitarism: Secondary | ICD-10-CM | POA: Diagnosis not present

## 2021-05-14 DIAGNOSIS — I10 Essential (primary) hypertension: Secondary | ICD-10-CM | POA: Diagnosis not present

## 2021-05-14 DIAGNOSIS — E2749 Other adrenocortical insufficiency: Secondary | ICD-10-CM | POA: Diagnosis not present

## 2021-06-10 DIAGNOSIS — W208XXA Other cause of strike by thrown, projected or falling object, initial encounter: Secondary | ICD-10-CM | POA: Diagnosis not present

## 2021-06-10 DIAGNOSIS — S9031XA Contusion of right foot, initial encounter: Secondary | ICD-10-CM | POA: Diagnosis not present

## 2021-08-20 DIAGNOSIS — E1165 Type 2 diabetes mellitus with hyperglycemia: Secondary | ICD-10-CM | POA: Diagnosis not present

## 2021-08-20 DIAGNOSIS — E038 Other specified hypothyroidism: Secondary | ICD-10-CM | POA: Diagnosis not present

## 2021-08-20 DIAGNOSIS — E785 Hyperlipidemia, unspecified: Secondary | ICD-10-CM | POA: Diagnosis not present

## 2021-08-27 DIAGNOSIS — E1165 Type 2 diabetes mellitus with hyperglycemia: Secondary | ICD-10-CM | POA: Diagnosis not present

## 2021-08-27 DIAGNOSIS — I10 Essential (primary) hypertension: Secondary | ICD-10-CM | POA: Diagnosis not present

## 2021-08-27 DIAGNOSIS — E2749 Other adrenocortical insufficiency: Secondary | ICD-10-CM | POA: Diagnosis not present

## 2021-08-27 DIAGNOSIS — E785 Hyperlipidemia, unspecified: Secondary | ICD-10-CM | POA: Diagnosis not present

## 2021-09-09 DIAGNOSIS — N9411 Superficial (introital) dyspareunia: Secondary | ICD-10-CM | POA: Diagnosis not present

## 2021-09-09 DIAGNOSIS — Z01419 Encounter for gynecological examination (general) (routine) without abnormal findings: Secondary | ICD-10-CM | POA: Diagnosis not present

## 2021-09-09 DIAGNOSIS — E2839 Other primary ovarian failure: Secondary | ICD-10-CM | POA: Diagnosis not present

## 2021-11-01 DIAGNOSIS — Z23 Encounter for immunization: Secondary | ICD-10-CM | POA: Diagnosis not present

## 2021-11-20 ENCOUNTER — Other Ambulatory Visit: Payer: Self-pay | Admitting: Gastroenterology

## 2021-11-20 NOTE — Telephone Encounter (Signed)
Good morning Dr Fuller Plan,  This is a patient of Dr Ardis Hughs.  I am sending this refill request to you as you are DOD am.  Please advise refills.  Thank you

## 2021-12-30 DIAGNOSIS — E2749 Other adrenocortical insufficiency: Secondary | ICD-10-CM | POA: Diagnosis not present

## 2021-12-30 DIAGNOSIS — E230 Hypopituitarism: Secondary | ICD-10-CM | POA: Diagnosis not present

## 2021-12-30 DIAGNOSIS — E1165 Type 2 diabetes mellitus with hyperglycemia: Secondary | ICD-10-CM | POA: Diagnosis not present

## 2021-12-30 DIAGNOSIS — E240 Pituitary-dependent Cushing's disease: Secondary | ICD-10-CM | POA: Diagnosis not present

## 2022-01-22 DIAGNOSIS — K080 Exfoliation of teeth due to systemic causes: Secondary | ICD-10-CM | POA: Diagnosis not present

## 2022-02-06 ENCOUNTER — Other Ambulatory Visit: Payer: Self-pay | Admitting: Gastroenterology

## 2022-02-06 DIAGNOSIS — K2990 Gastroduodenitis, unspecified, without bleeding: Secondary | ICD-10-CM

## 2022-03-14 DIAGNOSIS — H5213 Myopia, bilateral: Secondary | ICD-10-CM | POA: Diagnosis not present

## 2022-04-14 ENCOUNTER — Telehealth: Payer: Self-pay

## 2022-04-14 MED ORDER — MESALAMINE 1.2 G PO TBEC: 4.8000 g | DELAYED_RELEASE_TABLET | Freq: Every day | ORAL | 11 refills | Status: DC

## 2022-04-14 NOTE — Telephone Encounter (Signed)
Reviewed. Okay to refill for 1 year. Office follow-up 1 year

## 2022-04-14 NOTE — Telephone Encounter (Signed)
This is a patient of Dr Ardis Hughs.  We received a Rx request by fax for mesalamine 1.2 gm take 4 tablets daily.  Please advise on refills as you are DOD am.  Thank you

## 2022-04-14 NOTE — Telephone Encounter (Signed)
Rx sent to pharmacy as requested. Patient will follow up in 1 year.

## 2022-05-19 ENCOUNTER — Other Ambulatory Visit: Payer: Self-pay | Admitting: Gastroenterology

## 2022-07-01 DIAGNOSIS — E785 Hyperlipidemia, unspecified: Secondary | ICD-10-CM | POA: Diagnosis not present

## 2022-07-01 DIAGNOSIS — E230 Hypopituitarism: Secondary | ICD-10-CM | POA: Diagnosis not present

## 2022-07-01 DIAGNOSIS — E038 Other specified hypothyroidism: Secondary | ICD-10-CM | POA: Diagnosis not present

## 2022-07-01 DIAGNOSIS — E2749 Other adrenocortical insufficiency: Secondary | ICD-10-CM | POA: Diagnosis not present

## 2022-07-01 DIAGNOSIS — E1165 Type 2 diabetes mellitus with hyperglycemia: Secondary | ICD-10-CM | POA: Diagnosis not present

## 2022-07-08 DIAGNOSIS — I10 Essential (primary) hypertension: Secondary | ICD-10-CM | POA: Diagnosis not present

## 2022-07-08 DIAGNOSIS — E230 Hypopituitarism: Secondary | ICD-10-CM | POA: Diagnosis not present

## 2022-07-08 DIAGNOSIS — E2749 Other adrenocortical insufficiency: Secondary | ICD-10-CM | POA: Diagnosis not present

## 2022-07-08 DIAGNOSIS — E1165 Type 2 diabetes mellitus with hyperglycemia: Secondary | ICD-10-CM | POA: Diagnosis not present

## 2022-07-08 DIAGNOSIS — E240 Pituitary-dependent Cushing's disease: Secondary | ICD-10-CM | POA: Diagnosis not present

## 2022-07-13 IMAGING — MG DIGITAL DIAGNOSTIC BILAT W/ TOMO W/ CAD
8 series · 8 of 24 positions shown · non-contrast
Comparison: Previous exam(s).

CLINICAL DATA: 2+ year interval follow-up of a likely benign focal
asymmetry in the UPPER OUTER QUADRANT of the RIGHT breast. Annual
evaluation, LEFT breast.

EXAM:
DIGITAL DIAGNOSTIC BILATERAL MAMMOGRAM WITH TOMOSYNTHESIS AND CAD
TECHNIQUE: Bilateral digital diagnostic mammography and breast tomosynthesis
was performed. The images were evaluated with computer-aided
detection.

[L MLO synth-2D]
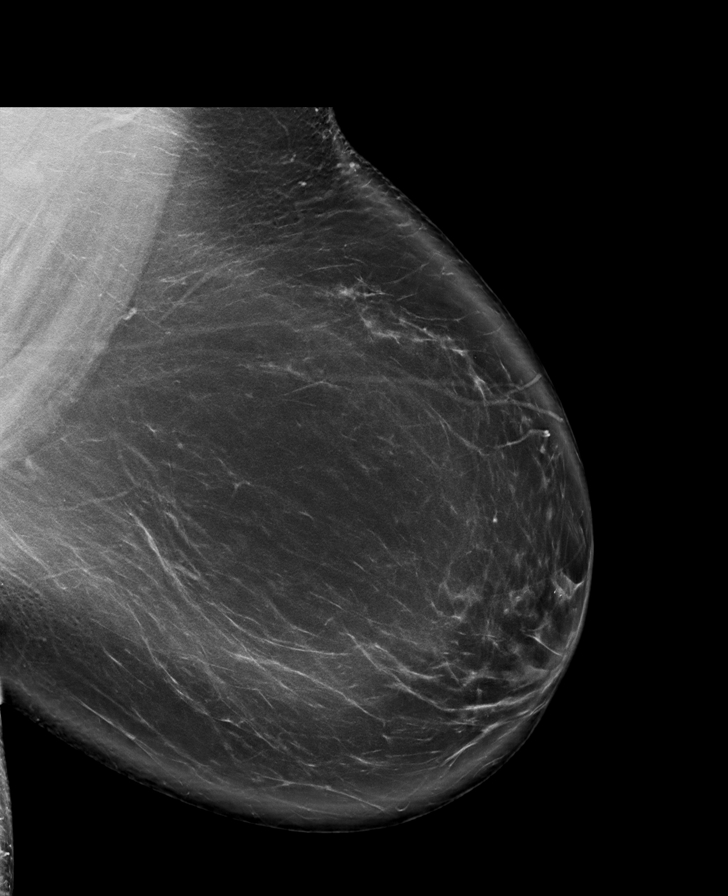

[R MLO synth-2D]
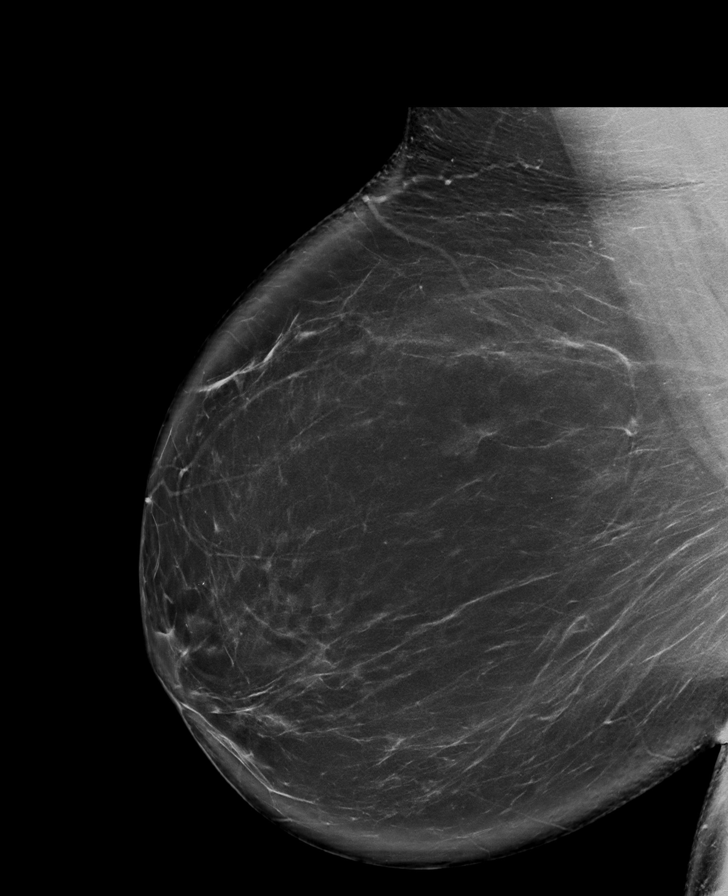

[L CC synth-2D]
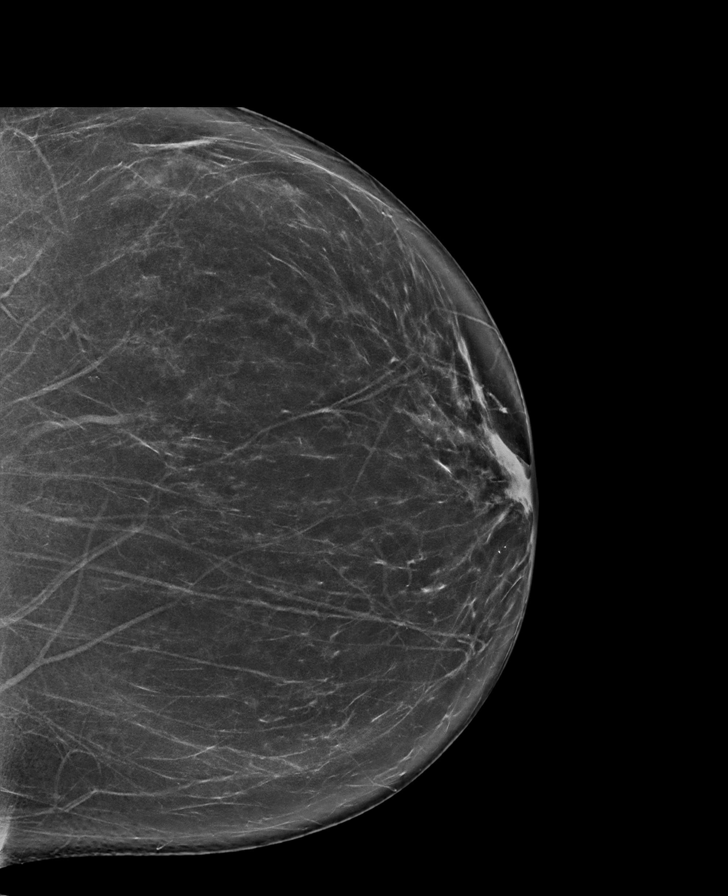

[R CC synth-2D]
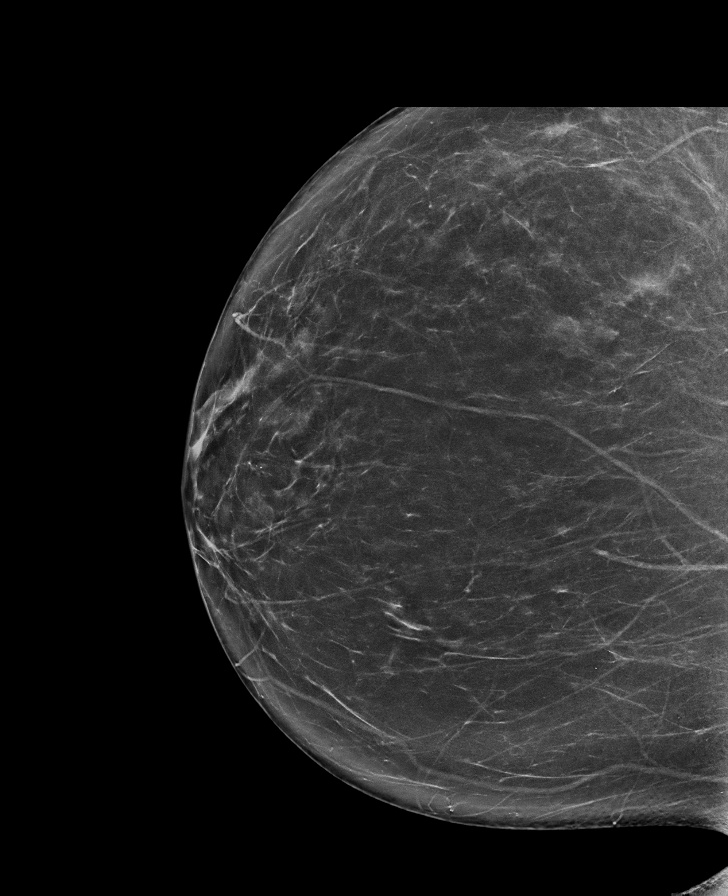

[L MLO tomo · tomo slice 57/112.0]
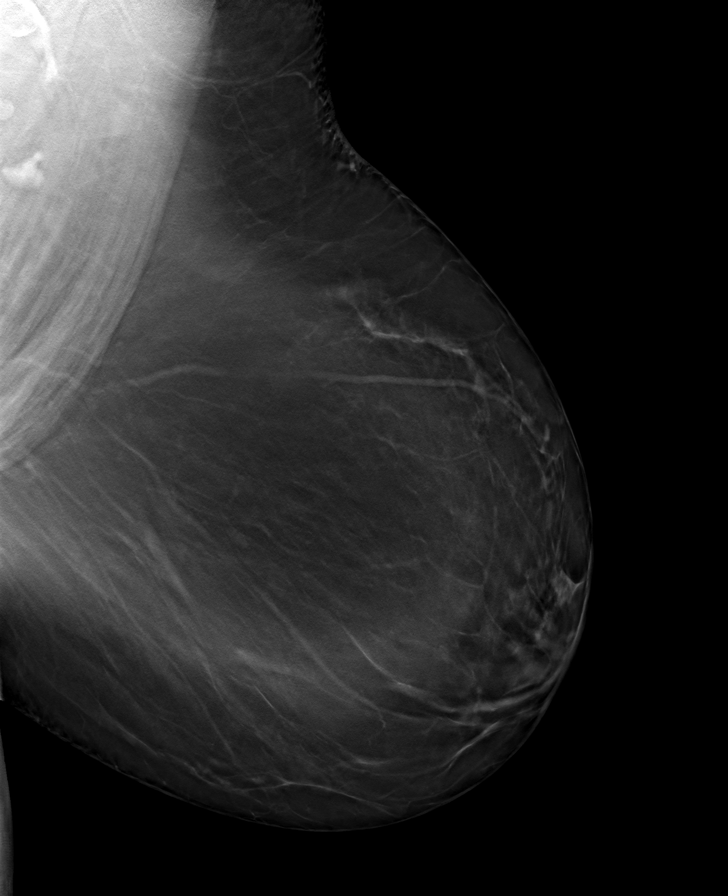

[R CC tomo · tomo slice 43/85.0]
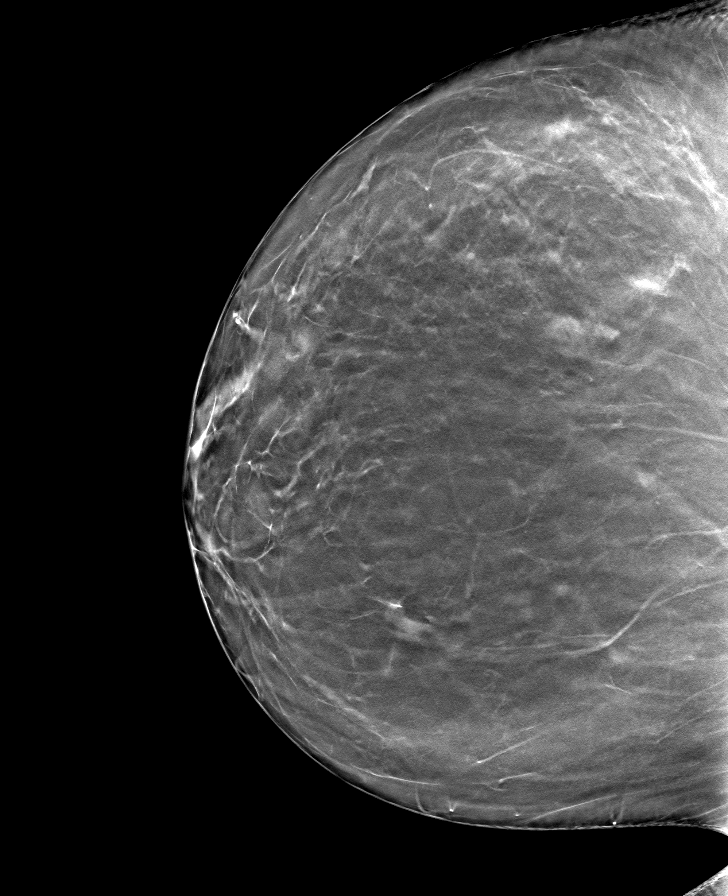

[R MLO tomo · tomo slice 55/110.0]
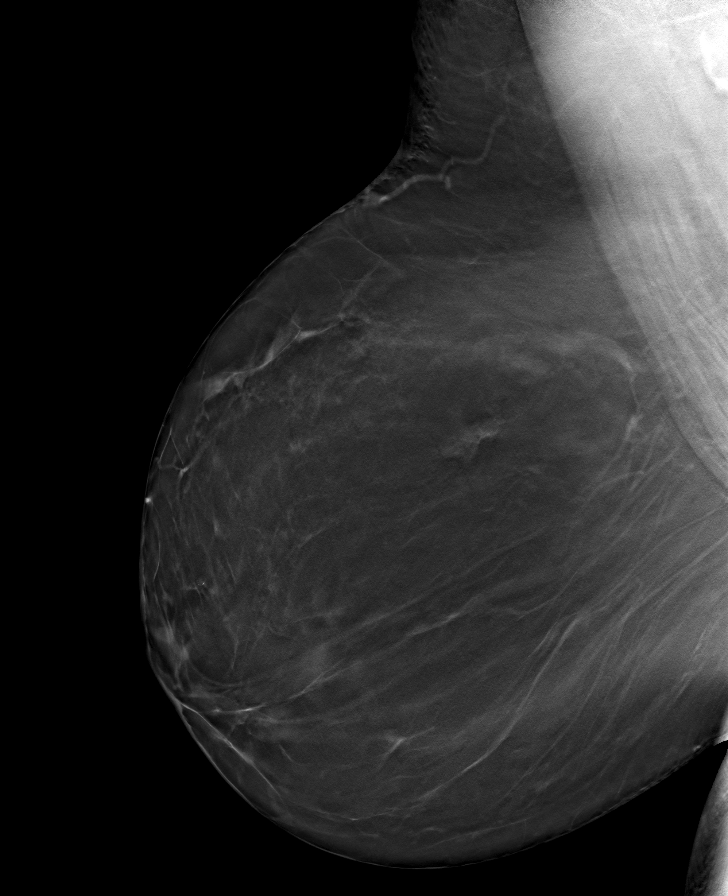

[L CC tomo · tomo slice 42/83.0]
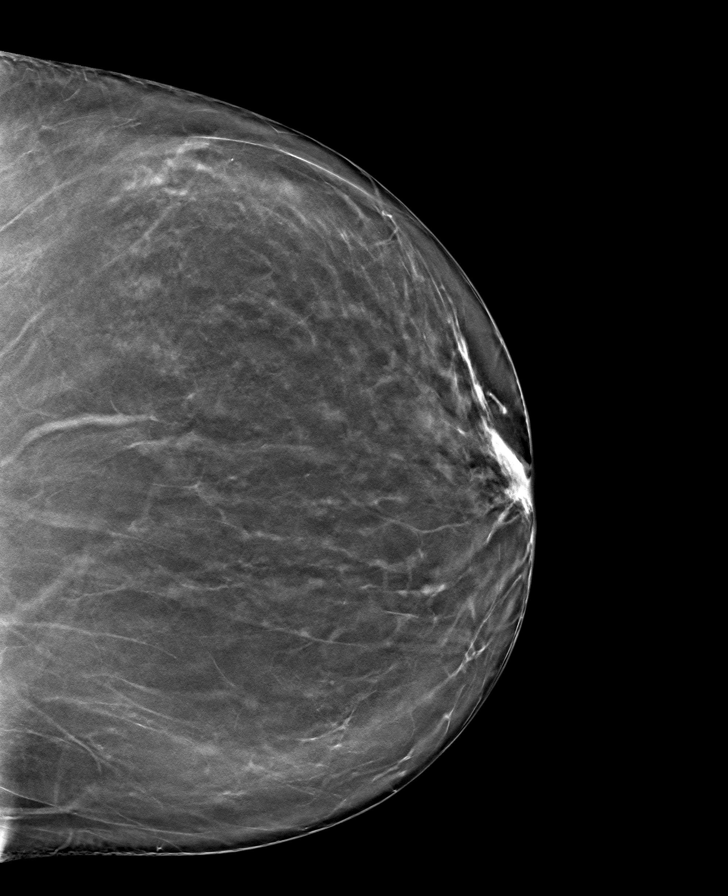

[8 of 24 positions shown; findings below may reference images not displayed]

ACR Breast Density Category b: There are scattered areas of
fibroglandular density.
FINDINGS: Full field CC and MLO views of both breasts were obtained.

RIGHT: No findings suspicious for malignancy. The focal asymmetry in
the UPPER OUTER QUADRANT at posterior depth is unchanged dating back
to the baseline mammogram in October 2018, confirming benignity.

LEFT: No findings suspicious for malignancy.
IMPRESSION: No mammographic evidence of malignancy involving either breast.

RECOMMENDATION:
Screening mammogram in one year.(Code:[id number])

I have discussed the findings and recommendations with the patient.
If applicable, a reminder letter will be sent to the patient
regarding the next appointment.

BI-RADS CATEGORY  2: Benign.

## 2022-07-29 DIAGNOSIS — K080 Exfoliation of teeth due to systemic causes: Secondary | ICD-10-CM | POA: Diagnosis not present

## 2022-11-03 ENCOUNTER — Other Ambulatory Visit: Payer: Self-pay | Admitting: Internal Medicine

## 2022-11-10 DIAGNOSIS — Z23 Encounter for immunization: Secondary | ICD-10-CM | POA: Diagnosis not present

## 2022-11-24 DIAGNOSIS — H5213 Myopia, bilateral: Secondary | ICD-10-CM | POA: Diagnosis not present

## 2022-12-19 ENCOUNTER — Ambulatory Visit: Payer: Medicare Other

## 2022-12-19 ENCOUNTER — Ambulatory Visit
Admission: RE | Admit: 2022-12-19 | Discharge: 2022-12-19 | Disposition: A | Discharge status: Home / Self Care | Payer: Medicare Other | Source: Ambulatory Visit | Attending: Family Medicine | Admitting: Family Medicine

## 2022-12-19 ENCOUNTER — Other Ambulatory Visit: Payer: Self-pay

## 2022-12-19 VITALS — BP 166/89 | HR 96 | Temp 98.6°F | Resp 16 | Ht 65.0 in | Wt 240.0 lb

## 2022-12-19 DIAGNOSIS — S92035A Nondisplaced avulsion fracture of tuberosity of left calcaneus, initial encounter for closed fracture: Secondary | ICD-10-CM | POA: Diagnosis not present

## 2022-12-19 DIAGNOSIS — S96992A Other specified injury of unspecified muscle and tendon at ankle and foot level, left foot, initial encounter: Secondary | ICD-10-CM

## 2022-12-19 DIAGNOSIS — M7732 Calcaneal spur, left foot: Secondary | ICD-10-CM | POA: Diagnosis not present

## 2022-12-19 DIAGNOSIS — M79672 Pain in left foot: Secondary | ICD-10-CM | POA: Diagnosis not present

## 2022-12-19 DIAGNOSIS — M25572 Pain in left ankle and joints of left foot: Secondary | ICD-10-CM | POA: Diagnosis not present

## 2022-12-19 MED ORDER — TRAMADOL HCL 50 MG PO TABS: 100.0000 mg | ORAL_TABLET | Freq: Four times a day (QID) | ORAL | 0 refills | Status: AC | PRN

## 2022-12-19 NOTE — Discharge Instructions (Addendum)
Remain nonweightbearing until otherwise instructed by orthopedic provider.  You may remove boot and elevate extremity and apply ice when at rest.  You may remove boot when you are sleeping. Call one of the offices listed above today to schedule follow-up visit with an orthopedist within 7 days

## 2022-12-19 NOTE — ED Provider Notes (Signed)
[name]    CSN: [id number] Arrival date & time: 12/19/22  1016      History   Chief Complaint Chief Complaint  Patient presents with   Ankle Pain    Ankle/foot pain after missing a step and turning ankle. - Entered by patient    HPI [name] is a 47 y.o. female.   The history is provided by the patient.  Ankle Pain Location:  Ankle and foot Time since incident:  8 days Injury: yes   Mechanism of injury comment:  Walking down steps, missing a step landing on left ankle and foot Ankle location:  L ankle Foot location:  L foot and dorsum of L foot Pain details:    Quality:  Throbbing   Radiates to:  L leg   Timing:  Intermittent   Progression:  Worsening Relieved by:  Ice, elevation and NSAIDs   Past Medical History:  Diagnosis Date   Adrenal insufficiency (HCC)    GERD (gastroesophageal reflux disease)    History of chickenpox    Hypertension    Pituitary tumor    Thyroid disease    Cushing's Disease    Patient Active Problem List   Diagnosis Date Noted   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      Home Medications    Prior to Admission medications   Medication Sig Start Date End Date Taking? Authorizing Provider  calcium carbonate (OS-CAL) 600 MG TABS tablet Take 600 mg by mouth daily with breakfast.   Yes [provider]  Cholecalciferol (VITAMIN D) 2000 units CAPS Take 1 capsule by mouth daily.   Yes [provider]  famotidine (PEPCID) 40 MG tablet TAKE 1 TABLET(40 MG) BY MOUTH AT BEDTIME 11/03/22  Yes Hilarie Fredrickson, MD  hydrocortisone  (CORTEF) 10 MG tablet Take 10 mg by mouth 2 (two) times daily.   Yes [provider]  levothyroxine (SYNTHROID, LEVOTHROID) 112 MCG tablet Take 1 tablet by mouth daily. 04/05/16  Yes [provider]  metoprolol succinate (TOPROL-XL) 25 MG 24 hr tablet Take 3 tablets by mouth daily.   Yes [provider]  pantoprazole (PROTONIX) 40 MG tablet TAKE 1 TABLET(40 MG) BY MOUTH TWICE DAILY 02/06/22  Yes Meryl Dare, MD  PREMPRO 0.625-5 MG tablet Take 1 tablet by mouth daily. 02/20/21  Yes [provider]  traMADol (ULTRAM) 50 MG tablet Take 2 tablets (100 mg total) by mouth every 6 (six) hours as needed for up to 4 days for moderate pain (pain score 4-6) or severe pain (pain score 7-10) (Calcaneus fracture). 12/19/22 12/23/22 Yes Bing Neighbors, NP  diphenoxylate-atropine (LOMOTIL) 2.5-0.025 MG tablet Take 1 tablet by mouth daily. 03/13/21   Rachael Fee, MD  mesalamine (LIALDA) 1.2 g EC tablet Take 4 tablets (4.8 g total) by mouth daily with breakfast. 04/14/22 05/14/22  Hilarie Fredrickson, MD  Somatropin (NORDITROPIN) 5 MG/1.5ML SOLN Inject 1 mL into the skin at bedtime.    [provider]    Family History Family History  Problem Relation Age of Onset   Hypertension  Paternal Aunt    Hyperlipidemia Paternal Aunt    Drug abuse Paternal Uncle    Stroke Paternal Uncle    Heart attack Paternal Uncle    Hypertension Maternal Grandfather    Hyperlipidemia Maternal Grandfather    Heart attack Maternal Grandfather    Prostate cancer Maternal Grandfather    Hyperlipidemia Paternal Grandmother    Hypertension Paternal Grandmother    Dementia Paternal Grandmother    Hyperlipidemia Paternal Grandfather    Hypertension Paternal Grandfather    COPD Paternal Grandfather    Diabetes Paternal Grandfather    Heart attack Paternal Grandfather    Breast cancer Paternal Aunt    Colon cancer Neg Hx    Stomach cancer Neg Hx    Esophageal cancer Neg Hx    Pancreatic  cancer Neg Hx     Social History Social History   Tobacco Use   Smoking status: Never   Smokeless tobacco: Never  Vaping Use   Vaping status: Never Used  Substance Use Topics   Alcohol use: No   Drug use: No     Allergies   Erythromycin and Penicillins   Review of Systems Review of Systems Pertinent negatives listed in HPI   Physical Exam Triage Vital Signs ED Triage Vitals  Encounter Vitals Group     BP 12/19/22 1045 (!) 166/89     Systolic BP Percentile --      Diastolic BP Percentile --      Pulse Rate 12/19/22 1045 96     Resp 12/19/22 1045 16     Temp 12/19/22 1045 98.6 F (37 C)     Temp Source 12/19/22 1045 Oral     SpO2 12/19/22 1045 99 %     Weight 12/19/22 1046 240 lb (108.9 kg)     Height 12/19/22 1046 5\' 5"  (1.651 m)     Head Circumference --      Peak Flow --      Pain Score 12/19/22 1046 6     Pain Loc --      Pain Education --      Exclude from Growth Chart --    No data found.  Updated Vital Signs BP (!) 166/89 (BP Location: Right Arm)   Pulse 96   Temp 98.6 F (37 C) (Oral)   Resp 16   Ht 5\' 5"  (1.651 m)   Wt 240 lb (108.9 kg)   SpO2 99%   BMI 39.94 kg/m   Visual Acuity Right Eye Distance:   Left Eye Distance:   Bilateral Distance:    Right Eye Near:   Left Eye Near:    Bilateral Near:     Physical Exam Vitals reviewed.  HENT:     Head: Normocephalic and atraumatic.  Cardiovascular:     Rate and Rhythm: Normal rate and regular rhythm.  Pulmonary:     Effort: Pulmonary effort is normal.     Breath sounds: Normal breath sounds.  Musculoskeletal:     Left ankle: Swelling and ecchymosis present. Decreased range of motion.     Left foot: Decreased range of motion. Swelling present.     Comments: Significant ecchymosis expanding over dorsum of left foot extending to toes  Skin:    Capillary Refill: Capillary refill takes less than 2 seconds.  Neurological:     General: No focal deficit present.     Mental Status: She is  alert.      UC Treatments / Results  Labs (all labs ordered are  listed, but only abnormal results are displayed) Labs Reviewed - No data to display  EKG   Radiology DG Ankle Complete Left  Result Date: 12/19/2022 CLINICAL DATA:  Twisted ankle 8 days ago now with bruising extending down lateral dorsum of foot. Left foot and ankle pain. EXAM: LEFT ANKLE COMPLETE - 3+ VIEW; LEFT FOOT - COMPLETE 3+ VIEW COMPARISON:  None Available. FINDINGS: Left ankle: The ankle mortise is symmetric and intact. Joint spaces preserved. Tiny plantar and posterior calcaneal heel spurs. There is a 4 mm bone density bordering the lateral aspect of the mid to inferior hindfoot on frontal view of the left ankle. Left foot: There is a 4 mm bone density just lateral to the distal aspect of the calcaneus on frontal view. Correlating with the frontal ankle radiograph, this appears represent an avulsion from the calcaneus at the origin of the extensor digitorum brevis muscle. IMPRESSION: Acute avulsion of the extensor digitorum brevis muscle at the distal lateral calcaneus. Electronically Signed   By: Neita Garnet M.D.   On: 12/19/2022 12:20   DG Foot Complete Left  Result Date: 12/19/2022 CLINICAL DATA:  Twisted ankle 8 days ago now with bruising extending down lateral dorsum of foot. Left foot and ankle pain. EXAM: LEFT ANKLE COMPLETE - 3+ VIEW; LEFT FOOT - COMPLETE 3+ VIEW COMPARISON:  None Available. FINDINGS: Left ankle: The ankle mortise is symmetric and intact. Joint spaces preserved. Tiny plantar and posterior calcaneal heel spurs. There is a 4 mm bone density bordering the lateral aspect of the mid to inferior hindfoot on frontal view of the left ankle. Left foot: There is a 4 mm bone density just lateral to the distal aspect of the calcaneus on frontal view. Correlating with the frontal ankle radiograph, this appears represent an avulsion from the calcaneus at the origin of the extensor digitorum brevis muscle.  IMPRESSION: Acute avulsion of the extensor digitorum brevis muscle at the distal lateral calcaneus. Electronically Signed   By: Neita Garnet M.D.   On: 12/19/2022 12:20    Procedures Procedures (including critical care time)  Medications Ordered in UC Medications - No data to display  Initial Impression / Assessment and Plan / UC Course  I have reviewed the triage vital signs and the nursing notes.  Pertinent labs & imaging results that were available during my care of the patient were reviewed by me and considered in my medical decision making (see chart for details).    Imaging significant for an avulsion fracture involving the distal lateral calcaneus.  Patient placed in a cam walker with crutches and advised to remain nonweightbearing.  Patient advised to follow-up with orthopedic provider and remain nonweightbearing until she is seen and evaluated by specialist.  Prescribed a small quantity of tramadol for severe pain.  Patient advised to continue with Motrin for mild to moderate pain.  Continue to elevate foot when at rest with ice.  May remove cam walker at bedtime however avoid any weightbearing activities.  Patient verbalized understanding and agreement with plan. Final Clinical Impressions(s) / UC Diagnoses   Final diagnoses:  Closed nondisplaced avulsion fracture of tuberosity of left calcaneus, initial encounter     Discharge Instructions      Remain nonweightbearing until otherwise instructed by orthopedic provider.  You may remove boot and elevate extremity and apply ice when at rest.  You may remove boot when you are sleeping. Call one of the offices listed above today to schedule follow-up visit with an orthopedist within 7  days     ED Prescriptions     Medication Sig Dispense Auth. Provider   traMADol (ULTRAM) 50 MG tablet Take 2 tablets (100 mg total) by mouth every 6 (six) hours as needed for up to 4 days for moderate pain (pain score 4-6) or severe pain (pain  score 7-10) (Calcaneus fracture). 16 tablet Bing Neighbors, NP      I have reviewed the PDMP during this encounter.   Bing Neighbors, NP 12/19/22 1531

## 2022-12-19 NOTE — ED Triage Notes (Signed)
Patient states that she missed a step on Thanksgiving and injured her left ankle.  Now her entire foot is bruised, able to walk on foot, some swelling.  Patient has taken Ibuprofen for pain.

## 2022-12-22 DIAGNOSIS — S92032A Displaced avulsion fracture of tuberosity of left calcaneus, initial encounter for closed fracture: Secondary | ICD-10-CM | POA: Diagnosis not present

## 2023-01-12 DIAGNOSIS — S92032A Displaced avulsion fracture of tuberosity of left calcaneus, initial encounter for closed fracture: Secondary | ICD-10-CM | POA: Diagnosis not present

## 2023-01-12 DIAGNOSIS — S93492A Sprain of other ligament of left ankle, initial encounter: Secondary | ICD-10-CM | POA: Diagnosis not present

## 2023-01-16 DIAGNOSIS — R29898 Other symptoms and signs involving the musculoskeletal system: Secondary | ICD-10-CM | POA: Diagnosis not present

## 2023-01-16 DIAGNOSIS — S92032A Displaced avulsion fracture of tuberosity of left calcaneus, initial encounter for closed fracture: Secondary | ICD-10-CM | POA: Diagnosis not present

## 2023-01-16 DIAGNOSIS — S92032D Displaced avulsion fracture of tuberosity of left calcaneus, subsequent encounter for fracture with routine healing: Secondary | ICD-10-CM | POA: Diagnosis not present

## 2023-01-16 DIAGNOSIS — M25572 Pain in left ankle and joints of left foot: Secondary | ICD-10-CM | POA: Diagnosis not present

## 2023-01-19 DIAGNOSIS — E785 Hyperlipidemia, unspecified: Secondary | ICD-10-CM | POA: Diagnosis not present

## 2023-01-19 DIAGNOSIS — E2749 Other adrenocortical insufficiency: Secondary | ICD-10-CM | POA: Diagnosis not present

## 2023-01-19 DIAGNOSIS — E038 Other specified hypothyroidism: Secondary | ICD-10-CM | POA: Diagnosis not present

## 2023-01-19 DIAGNOSIS — E1165 Type 2 diabetes mellitus with hyperglycemia: Secondary | ICD-10-CM | POA: Diagnosis not present

## 2023-01-23 DIAGNOSIS — S92032D Displaced avulsion fracture of tuberosity of left calcaneus, subsequent encounter for fracture with routine healing: Secondary | ICD-10-CM | POA: Diagnosis not present

## 2023-01-23 DIAGNOSIS — R29898 Other symptoms and signs involving the musculoskeletal system: Secondary | ICD-10-CM | POA: Diagnosis not present

## 2023-01-23 DIAGNOSIS — M25572 Pain in left ankle and joints of left foot: Secondary | ICD-10-CM | POA: Diagnosis not present

## 2023-01-26 DIAGNOSIS — I10 Essential (primary) hypertension: Secondary | ICD-10-CM | POA: Diagnosis not present

## 2023-01-26 DIAGNOSIS — E230 Hypopituitarism: Secondary | ICD-10-CM | POA: Diagnosis not present

## 2023-01-26 DIAGNOSIS — E2749 Other adrenocortical insufficiency: Secondary | ICD-10-CM | POA: Diagnosis not present

## 2023-01-26 DIAGNOSIS — E240 Pituitary-dependent Cushing's disease: Secondary | ICD-10-CM | POA: Diagnosis not present

## 2023-01-28 DIAGNOSIS — R29898 Other symptoms and signs involving the musculoskeletal system: Secondary | ICD-10-CM | POA: Diagnosis not present

## 2023-01-28 DIAGNOSIS — M25572 Pain in left ankle and joints of left foot: Secondary | ICD-10-CM | POA: Diagnosis not present

## 2023-01-28 DIAGNOSIS — S92032D Displaced avulsion fracture of tuberosity of left calcaneus, subsequent encounter for fracture with routine healing: Secondary | ICD-10-CM | POA: Diagnosis not present

## 2023-02-09 DIAGNOSIS — S92032D Displaced avulsion fracture of tuberosity of left calcaneus, subsequent encounter for fracture with routine healing: Secondary | ICD-10-CM | POA: Diagnosis not present

## 2023-02-09 DIAGNOSIS — M25572 Pain in left ankle and joints of left foot: Secondary | ICD-10-CM | POA: Diagnosis not present

## 2023-02-09 DIAGNOSIS — R29898 Other symptoms and signs involving the musculoskeletal system: Secondary | ICD-10-CM | POA: Diagnosis not present

## 2023-02-09 DIAGNOSIS — S92032A Displaced avulsion fracture of tuberosity of left calcaneus, initial encounter for closed fracture: Secondary | ICD-10-CM | POA: Diagnosis not present

## 2023-02-09 DIAGNOSIS — S93492A Sprain of other ligament of left ankle, initial encounter: Secondary | ICD-10-CM | POA: Diagnosis not present

## 2023-02-11 ENCOUNTER — Other Ambulatory Visit (HOSPITAL_COMMUNITY)
Admission: RE | Admit: 2023-02-11 | Discharge: 2023-02-11 | Disposition: A | Discharge status: Home / Self Care | Payer: Medicare Other | Source: Ambulatory Visit | Attending: Obstetrics and Gynecology | Admitting: Obstetrics and Gynecology

## 2023-02-11 ENCOUNTER — Other Ambulatory Visit: Payer: Self-pay | Admitting: Obstetrics and Gynecology

## 2023-02-11 DIAGNOSIS — Z01419 Encounter for gynecological examination (general) (routine) without abnormal findings: Secondary | ICD-10-CM | POA: Diagnosis not present

## 2023-02-11 DIAGNOSIS — E2839 Other primary ovarian failure: Secondary | ICD-10-CM | POA: Diagnosis not present

## 2023-02-11 DIAGNOSIS — Z1151 Encounter for screening for human papillomavirus (HPV): Secondary | ICD-10-CM | POA: Insufficient documentation

## 2023-02-16 DIAGNOSIS — M25572 Pain in left ankle and joints of left foot: Secondary | ICD-10-CM | POA: Diagnosis not present

## 2023-02-16 DIAGNOSIS — R29898 Other symptoms and signs involving the musculoskeletal system: Secondary | ICD-10-CM | POA: Diagnosis not present

## 2023-02-16 DIAGNOSIS — S92032D Displaced avulsion fracture of tuberosity of left calcaneus, subsequent encounter for fracture with routine healing: Secondary | ICD-10-CM | POA: Diagnosis not present

## 2023-02-17 LAB — CYTOLOGY - PAP
Comment: NEGATIVE
Diagnosis: NEGATIVE
Diagnosis: REACTIVE
High risk HPV: NEGATIVE

## 2023-02-23 ENCOUNTER — Other Ambulatory Visit: Payer: Self-pay | Admitting: Obstetrics and Gynecology

## 2023-02-23 DIAGNOSIS — Z1231 Encounter for screening mammogram for malignant neoplasm of breast: Secondary | ICD-10-CM

## 2023-02-27 DIAGNOSIS — R29898 Other symptoms and signs involving the musculoskeletal system: Secondary | ICD-10-CM | POA: Diagnosis not present

## 2023-02-27 DIAGNOSIS — M25572 Pain in left ankle and joints of left foot: Secondary | ICD-10-CM | POA: Diagnosis not present

## 2023-02-27 DIAGNOSIS — S92032D Displaced avulsion fracture of tuberosity of left calcaneus, subsequent encounter for fracture with routine healing: Secondary | ICD-10-CM | POA: Diagnosis not present

## 2023-03-06 ENCOUNTER — Ambulatory Visit: Payer: Medicare Other

## 2023-03-06 DIAGNOSIS — J019 Acute sinusitis, unspecified: Secondary | ICD-10-CM | POA: Diagnosis not present

## 2023-03-06 DIAGNOSIS — J40 Bronchitis, not specified as acute or chronic: Secondary | ICD-10-CM | POA: Diagnosis not present

## 2023-03-09 DIAGNOSIS — M25572 Pain in left ankle and joints of left foot: Secondary | ICD-10-CM | POA: Diagnosis not present

## 2023-03-09 DIAGNOSIS — S92032D Displaced avulsion fracture of tuberosity of left calcaneus, subsequent encounter for fracture with routine healing: Secondary | ICD-10-CM | POA: Diagnosis not present

## 2023-03-09 DIAGNOSIS — R29898 Other symptoms and signs involving the musculoskeletal system: Secondary | ICD-10-CM | POA: Diagnosis not present

## 2023-03-13 ENCOUNTER — Ambulatory Visit
Admission: RE | Admit: 2023-03-13 | Discharge: 2023-03-13 | Disposition: A | Discharge status: Home / Self Care | Payer: Medicare Other | Source: Ambulatory Visit | Attending: Family Medicine | Admitting: Family Medicine

## 2023-03-13 VITALS — BP 167/119 | HR 103 | Temp 98.3°F | Resp 16

## 2023-03-13 DIAGNOSIS — R29898 Other symptoms and signs involving the musculoskeletal system: Secondary | ICD-10-CM | POA: Diagnosis not present

## 2023-03-13 DIAGNOSIS — M25572 Pain in left ankle and joints of left foot: Secondary | ICD-10-CM | POA: Diagnosis not present

## 2023-03-13 DIAGNOSIS — J019 Acute sinusitis, unspecified: Secondary | ICD-10-CM

## 2023-03-13 DIAGNOSIS — S92032D Displaced avulsion fracture of tuberosity of left calcaneus, subsequent encounter for fracture with routine healing: Secondary | ICD-10-CM | POA: Diagnosis not present

## 2023-03-13 MED ORDER — CEFDINIR 300 MG PO CAPS: 300.0000 mg | ORAL_CAPSULE | Freq: Two times a day (BID) | ORAL | 0 refills | Status: DC

## 2023-03-13 NOTE — ED Triage Notes (Signed)
 Pt presents to uc with co of sinus congestion and cough and ha for 2 weeks. Pt reports otc robustin and motrin

## 2023-03-13 NOTE — ED Provider Notes (Signed)
 [name]    CSN: [id number] Arrival date & time: 03/13/23  1325      History   Chief Complaint Chief Complaint  Patient presents with   Nasal Congestion    Sinus congestion and cough x 2.5 weeks - Entered by patient    HPI [name] is a 48 y.o. female.   About 3 weeks ago patient developed URI symptoms with sore throat, sinus congestion, and cough.  On 03/06/23 she participated in a telemedicine visit ([facility]), resulting in diagnosis of sinusitis and treated with doxycycline.  She reports that she was unable to tolerate the doxycycline.  She complains of persistent cough, headache, and sinus/facial pressure.  She denies pleuritic pain, shortness of breath, and fevers, chills, and sweats.  The history is provided by the patient.    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      Home Medications    Prior to Admission medications   Medication Sig Start Date End Date Taking? Authorizing Provider  cefdinir (OMNICEF) 300 MG capsule Take 1 capsule (300 mg total) by mouth 2 (two) times daily. 03/13/23  Yes Lattie Haw, MD  SYNJARDY XR 25-1000 MG TB24 Take 1 tablet by mouth every morning. 01/06/23  Yes [provider]  calcium carbonate (OS-CAL) 600 MG TABS tablet Take 600 mg by mouth daily with  breakfast.    [provider]  Cholecalciferol (VITAMIN D) 2000 units CAPS Take 1 capsule by mouth daily.    [provider]  diphenoxylate-atropine (LOMOTIL) 2.5-0.025 MG tablet Take 1 tablet by mouth daily. 03/13/21   Rachael Fee, MD  famotidine (PEPCID) 40 MG tablet TAKE 1 TABLET(40 MG) BY MOUTH AT BEDTIME 11/03/22   Hilarie Fredrickson, MD  hydrocortisone (CORTEF) 10 MG tablet Take 10 mg by mouth 2 (two) times daily.    [provider]  levothyroxine (SYNTHROID, LEVOTHROID) 112 MCG tablet Take 1 tablet by mouth daily. 04/05/16   [provider]  mesalamine (LIALDA) 1.2 g EC tablet Take 4 tablets (4.8 g total) by mouth daily with breakfast. 04/14/22 05/14/22  Hilarie Fredrickson, MD  metoprolol succinate (TOPROL-XL) 25 MG 24 hr tablet Take 3 tablets by mouth daily.    [provider]  pantoprazole (PROTONIX) 40 MG tablet TAKE 1 TABLET(40 MG) BY MOUTH TWICE DAILY 02/06/22   Meryl Dare, MD  PREMPRO 0.625-5 MG tablet Take 1 tablet by mouth daily. 02/20/21   [provider]  Somatropin (NORDITROPIN) 5 MG/1.5ML SOLN Inject 1 mL into the skin at bedtime.    [provider]    Family History Family History  Problem Relation Age of Onset   Hypertension Paternal Aunt    Hyperlipidemia Paternal Aunt    Drug abuse Paternal Uncle  Stroke Paternal Uncle    Heart attack Paternal Uncle    Hypertension Maternal Grandfather    Hyperlipidemia Maternal Grandfather    Heart attack Maternal Grandfather    Prostate cancer Maternal Grandfather    Hyperlipidemia Paternal Grandmother    Hypertension Paternal Grandmother    Dementia Paternal Grandmother    Hyperlipidemia Paternal Grandfather    Hypertension Paternal Grandfather    COPD Paternal Grandfather    Diabetes Paternal Grandfather    Heart attack Paternal Grandfather    Breast cancer Paternal Aunt    Colon cancer Neg Hx    Stomach cancer Neg Hx    Esophageal cancer Neg Hx    Pancreatic  cancer Neg Hx     Social History Social History   Tobacco Use   Smoking status: Never   Smokeless tobacco: Never  Vaping Use   Vaping status: Never Used  Substance Use Topics   Alcohol use: No   Drug use: No     Allergies   Erythromycin and Penicillins   Review of Systems Review of Systems + sore throat + cough No pleuritic pain No wheezing + nasal congestion + post-nasal drainage + sinus pain/pressure No itchy/red eyes ? earache No hemoptysis No SOB No fever No nausea No vomiting No abdominal pain No diarrhea No urinary symptoms No skin rash + fatigue No myalgias + headache Used OTC meds (Robitussin DM, ibuprofen) without relief   Physical Exam Triage Vital Signs ED Triage Vitals  Encounter Vitals Group     BP 03/13/23 1338 (!) 167/119     Systolic BP Percentile --      Diastolic BP Percentile --      Pulse Rate 03/13/23 1338 (!) 103     Resp 03/13/23 1338 16     Temp 03/13/23 1338 98.3 F (36.8 C)     Temp src --      SpO2 03/13/23 1338 98 %     Weight --      Height --      Head Circumference --      Peak Flow --      Pain Score 03/13/23 1337 5     Pain Loc --      Pain Education --      Exclude from Growth Chart --    No data found.  Updated Vital Signs BP (!) 167/119   Pulse (!) 103   Temp 98.3 F (36.8 C)   Resp 16   SpO2 98%   Visual Acuity Right Eye Distance:   Left Eye Distance:   Bilateral Distance:    Right Eye Near:   Left Eye Near:    Bilateral Near:     Physical Exam Nursing notes and Vital Signs reviewed. Appearance:  Patient appears stated age, and in no acute distress Eyes:  Pupils are equal, round, and reactive to light and accomodation.  Extraocular movement is intact.  Conjunctivae are not inflamed  Ears:  Canals normal.  Tympanic membranes normal.  Nose:  Congested turbinates.  Maxillary and frontal sinus tenderness is present.  Pharynx:  Normal Neck:  Supple. No adenopathy.  Lungs:  Clear to  auscultation.  Breath sounds are equal.  Moving air well. Heart:  Regular rate and rhythm without murmurs, rubs, or gallops.  Abdomen:  Nontender without masses or hepatosplenomegaly.  Bowel sounds are present.  No CVA or flank tenderness.  Extremities:  No edema.  Skin:  No rash present.   UC Treatments / Results  Labs (  all labs ordered are listed, but only abnormal results are displayed) Labs Reviewed - No data to display  EKG   Radiology No results found.  Procedures Procedures (including critical care time)  Medications Ordered in UC Medications - No data to display  Initial Impression / Assessment and Plan / UC Course  I have reviewed the triage vital signs and the nursing notes.  Pertinent labs & imaging results that were available during my care of the patient were reviewed by me and considered in my medical decision making (see chart for details).    Begin Omnicef for one week. Followup with Family Doctor if not improved in 7 to 10 days.  Final Clinical Impressions(s) / UC Diagnoses   Final diagnoses:  Acute sinusitis, recurrence not specified, unspecified location     Discharge Instructions      Take plain Robitussin daytime, with plenty of water, for cough and congestion.  May take Robitussin DM at bedtime. May use Afrin nasal spray (or generic oxymetazoline) each morning for about 5 days and then discontinue.  Also recommend using saline nasal spray several times daily and saline nasal irrigation (AYR is a common brand).  Use Flonase nasal spray each morning after using Afrin nasal spray and saline nasal irrigation. Try warm salt water gargles for sore throat.  Stop all antihistamines for now, and other non-prescription cough/cold preparations.       ED Prescriptions     Medication Sig Dispense Auth. Provider   cefdinir (OMNICEF) 300 MG capsule Take 1 capsule (300 mg total) by mouth 2 (two) times daily. 14 capsule Lattie Haw, MD          Lattie Haw, MD 03/15/23 [phone]

## 2023-03-13 NOTE — Discharge Instructions (Signed)
 Take plain Robitussin daytime, with plenty of water, for cough and congestion.  May take Robitussin DM at bedtime. May use Afrin nasal spray (or generic oxymetazoline) each morning for about 5 days and then discontinue.  Also recommend using saline nasal spray several times daily and saline nasal irrigation (AYR is a common brand).  Use Flonase nasal spray each morning after using Afrin nasal spray and saline nasal irrigation. Try warm salt water gargles for sore throat.  Stop all antihistamines for now, and other non-prescription cough/cold preparations.

## 2023-03-20 DIAGNOSIS — S92032D Displaced avulsion fracture of tuberosity of left calcaneus, subsequent encounter for fracture with routine healing: Secondary | ICD-10-CM | POA: Diagnosis not present

## 2023-03-20 DIAGNOSIS — M25572 Pain in left ankle and joints of left foot: Secondary | ICD-10-CM | POA: Diagnosis not present

## 2023-03-20 DIAGNOSIS — R29898 Other symptoms and signs involving the musculoskeletal system: Secondary | ICD-10-CM | POA: Diagnosis not present

## 2023-03-23 ENCOUNTER — Ambulatory Visit
Admission: RE | Admit: 2023-03-23 | Discharge: 2023-03-23 | Disposition: A | Discharge status: Home / Self Care | Payer: Medicare Other | Source: Ambulatory Visit | Attending: Obstetrics and Gynecology | Admitting: Obstetrics and Gynecology

## 2023-03-23 DIAGNOSIS — Z1231 Encounter for screening mammogram for malignant neoplasm of breast: Secondary | ICD-10-CM

## 2023-03-27 DIAGNOSIS — S92032D Displaced avulsion fracture of tuberosity of left calcaneus, subsequent encounter for fracture with routine healing: Secondary | ICD-10-CM | POA: Diagnosis not present

## 2023-03-27 DIAGNOSIS — M25572 Pain in left ankle and joints of left foot: Secondary | ICD-10-CM | POA: Diagnosis not present

## 2023-03-30 DIAGNOSIS — S92032A Displaced avulsion fracture of tuberosity of left calcaneus, initial encounter for closed fracture: Secondary | ICD-10-CM | POA: Diagnosis not present

## 2023-03-30 DIAGNOSIS — S93492A Sprain of other ligament of left ankle, initial encounter: Secondary | ICD-10-CM | POA: Diagnosis not present

## 2023-04-20 DIAGNOSIS — Z1330 Encounter for screening examination for mental health and behavioral disorders, unspecified: Secondary | ICD-10-CM | POA: Diagnosis not present

## 2023-04-20 DIAGNOSIS — E240 Pituitary-dependent Cushing's disease: Secondary | ICD-10-CM | POA: Diagnosis not present

## 2023-04-20 DIAGNOSIS — N760 Acute vaginitis: Secondary | ICD-10-CM | POA: Diagnosis not present

## 2023-04-20 DIAGNOSIS — I10 Essential (primary) hypertension: Secondary | ICD-10-CM | POA: Diagnosis not present

## 2023-04-21 ENCOUNTER — Ambulatory Visit: Payer: Medicare Other | Admitting: Internal Medicine

## 2023-04-30 ENCOUNTER — Other Ambulatory Visit: Payer: Self-pay | Admitting: Internal Medicine

## 2023-05-04 ENCOUNTER — Encounter: Payer: Self-pay | Admitting: Gastroenterology

## 2023-05-04 ENCOUNTER — Ambulatory Visit: Admitting: Gastroenterology

## 2023-05-04 VITALS — BP 150/90 | HR 84 | Ht 65.0 in | Wt 246.0 lb

## 2023-05-04 DIAGNOSIS — K529 Noninfective gastroenteritis and colitis, unspecified: Secondary | ICD-10-CM | POA: Diagnosis not present

## 2023-05-04 DIAGNOSIS — Z8639 Personal history of other endocrine, nutritional and metabolic disease: Secondary | ICD-10-CM | POA: Diagnosis not present

## 2023-05-04 DIAGNOSIS — E2740 Unspecified adrenocortical insufficiency: Secondary | ICD-10-CM | POA: Diagnosis not present

## 2023-05-04 MED ORDER — MESALAMINE 1.2 G PO TBEC: 4.8000 g | DELAYED_RELEASE_TABLET | Freq: Every day | ORAL | 3 refills | Status: AC

## 2023-05-04 NOTE — Progress Notes (Signed)
 Chief Complaint: Primary GI MD: Dr. Howard Macho (now Dr. Elvin Hammer)  HPI: Discussed the use of AI scribe software for clinical note transcription with the patient, who gave verbal consent to proceed.  History of Present Illness A 48 year old female history of adrenal insufficiency, hypertension, pituitary tumor, Cushing's, GERD, presents for follow-up of chronic loose stools.  Previous extensive workup by Dr. Howard Macho.  Please see his last note for details.  She has experienced chronic loose stools for the majority of her adult life and underwent colonoscopy/EGD in 2018 revealed patchy inflammation at the hepatic flexure and a single polyp. Initially, she was prescribed Lialda  1.2 grams, two pills a day, which was later increased to four pills a day. This medication has improved her diarrhea, reducing the frequency from five to six times a day to about two times a day, although the diarrhea has not completely resolved and sometimes flares up.  She was also prescribed Imodium but discontinued it due to cramping. She identifies certain foods as triggers for her symptoms, particularly bagged salads, which she associates with preservatives. She does not report issues with dairy or gluten, and a test for celiac disease was negative. She has not tried fiber supplements recently.  Her past medical history includes adrenal insufficiency for which she takes hydrocortisone since 2007 or 2008 following surgeries and radiation for a pituitary tumor (Cushing's disease). She has not noticed a significant change in her stool pattern since starting steroids.  No oily or greasy stools, but there is occasional mucus in her stools. No bloating or floating stools. She has not been on a trial of Xifaxan for IBS-D.   PREVIOUS GI WORKUP   1. GERD-like dyspepsia: Labs 04/2016 CBC, CMET normal. EGD Dr. Howard Macho 2018 found mild gastritis, h pylori negative. 07/2016 US  was normal.  2. Chronic loose stools 08/2016: tTG, total IgA  were normal.  Colonoscopy 01/2016 showed a normal terminal ileum, patchy area of mild circumferential inflammation at her hepatic flexure, a single polyp.  The polyp was hyperplastic, random colon biopsies were all normal, the area of patchy inflammation in her hepatic flexure was mild acute inflammation on pathology.  I started her on Lialda  1.2 g 2 pills once daily. Increased to lialda  4 pills daily (trial) with good response.  October 2019 added single Imodium every morning.  Past Medical History:  Diagnosis Date   Adrenal insufficiency (HCC)    GERD (gastroesophageal reflux disease)    History of chickenpox    Hypertension    Pituitary tumor    Thyroid  disease    Cushing's Disease    Past Surgical History:  Procedure Laterality Date   EYE SURGERY Bilateral    x 2 as a child   gamma knife radiation  09/2003   TRANSPHENOIDAL / TRANSNASAL HYPOPHYSECTOMY / RESECTION PITUITARY TUMOR  02/2003, 07/2003    Current Outpatient Medications  Medication Sig Dispense Refill   calcium carbonate (OS-CAL) 600 MG TABS tablet Take 600 mg by mouth daily with breakfast.     Cholecalciferol (VITAMIN D ) 2000 units CAPS Take 1 capsule by mouth daily.     diphenoxylate -atropine  (LOMOTIL ) 2.5-0.025 MG tablet Take 1 tablet by mouth daily. 60 tablet 11   DUAVEE 0.45-20 MG TABS Take 1 tablet by mouth daily.     famotidine  (PEPCID ) 40 MG tablet TAKE 1 TABLET(40 MG) BY MOUTH AT BEDTIME 90 tablet 1   hydrocortisone (CORTEF) 10 MG tablet Take 10 mg by mouth 2 (two) times daily.     levothyroxine (SYNTHROID,  LEVOTHROID) 112 MCG tablet Take 1 tablet by mouth daily.  5   metoprolol succinate (TOPROL-XL) 25 MG 24 hr tablet Take 3 tablets by mouth daily.     pantoprazole  (PROTONIX ) 40 MG tablet TAKE 1 TABLET(40 MG) BY MOUTH TWICE DAILY 180 tablet 3   rosuvastatin (CRESTOR) 10 MG tablet Take 10 mg by mouth at bedtime.     SYNJARDY XR 25-1000 MG TB24 Take 1 tablet by mouth every morning.     mesalamine  (LIALDA ) 1.2 g EC  tablet Take 4 tablets (4.8 g total) by mouth daily with breakfast. 360 tablet 3   PREMPRO 0.625-5 MG tablet Take 1 tablet by mouth daily. (Patient not taking: Reported on 05/04/2023)     Somatropin (NORDITROPIN) 5 MG/1.5ML SOLN Inject 1 mL into the skin at bedtime. (Patient not taking: Reported on 05/04/2023)     No current facility-administered medications for this visit.    Allergies as of 05/04/2023 - Review Complete 05/04/2023  Allergen Reaction Noted   Erythromycin Nausea And Vomiting 11/01/2010   Penicillins Rash 11/01/2010    Family History  Problem Relation Age of Onset   Hypertension Paternal Aunt    Hyperlipidemia Paternal Aunt    Drug abuse Paternal Uncle    Stroke Paternal Uncle    Heart attack Paternal Uncle    Hypertension Maternal Grandfather    Hyperlipidemia Maternal Grandfather    Heart attack Maternal Grandfather    Prostate cancer Maternal Grandfather    Hyperlipidemia Paternal Grandmother    Hypertension Paternal Grandmother    Dementia Paternal Grandmother    Hyperlipidemia Paternal Grandfather    Hypertension Paternal Grandfather    COPD Paternal Grandfather    Diabetes Paternal Grandfather    Heart attack Paternal Grandfather    Breast cancer Paternal Aunt    Colon cancer Neg Hx    Stomach cancer Neg Hx    Esophageal cancer Neg Hx    Pancreatic cancer Neg Hx     Social History   Socioeconomic History   Marital status: Single    Spouse name: Not on file   Number of children: Not on file   Years of education: Not on file   Highest education level: Not on file  Occupational History   Not on file  Tobacco Use   Smoking status: Never   Smokeless tobacco: Never  Vaping Use   Vaping status: Never Used  Substance and Sexual Activity   Alcohol use: No   Drug use: No   Sexual activity: Yes    Birth control/protection: Condom  Other Topics Concern   Not on file  Social History Narrative   Not on file   Social Drivers of Health   Financial  Resource Strain: Low Risk  (04/20/2023)   Received from [facility]   Overall Financial Resource Strain (CARDIA)    Difficulty of Paying Living Expenses: Not very hard  Food Insecurity: No Food Insecurity (04/20/2023)   Received from [facility]   Hunger Vital Sign    Worried About Running Out of Food in the Last Year: Never true    Ran Out of Food in the Last Year: Never true  Transportation Needs: No Transportation Needs (04/20/2023)   Received from [facility] - Transportation    Lack of Transportation (Medical): No    Lack of Transportation (Non-Medical): No  Physical Activity: Sufficiently Active (12/22/2022)   Received from [facility]   Exercise Vital Sign    Days of Exercise per Week:  5 days    Minutes of Exercise per Session: 30 min  Stress: No Stress Concern Present (12/22/2022)   Received from [facility] - Occupational Stress Questionnaire    Feeling of Stress : Not at all  Social Connections: Socially Integrated (12/22/2022)   Received from [facility]   Social Network    How would you rate your social network (family, work, friends)?: Good participation with social networks  Intimate Partner Violence: Not At Risk (12/22/2022)   Received from [facility]   HITS    Over the last 12 months how often did your partner physically hurt you?: Never    Over the last 12 months how often did your partner insult you or talk down to you?: Never    Over the last 12 months how often did your partner threaten you with physical harm?: Never    Over the last 12 months how often did your partner scream or curse at you?: Never    Review of Systems:    Constitutional: No weight loss, fever, chills, weakness or fatigue HEENT: Eyes: No change in vision               Ears, Nose, Throat:  No change in hearing or congestion Skin: No rash or itching Cardiovascular: No chest pain, chest pressure or palpitations   Respiratory: No SOB  or cough Gastrointestinal: See HPI and otherwise negative Genitourinary: No dysuria or change in urinary frequency Neurological: No headache, dizziness or syncope Musculoskeletal: No new muscle or joint pain Hematologic: No bleeding or bruising Psychiatric: No history of depression or anxiety    Physical Exam:  Vital signs: BP (!) 150/90   Pulse 84   Ht 5\' 5"  (1.651 m)   Wt 246 lb (111.6 kg)   BMI 40.94 kg/m   Constitutional: NAD, alert and cooperative Head:  Normocephalic and atraumatic. Eyes:   PEERL, EOMI. No icterus. Conjunctiva pink. Respiratory: Respirations even and unlabored. Lungs clear to auscultation bilaterally.   No wheezes, crackles, or rhonchi.  Cardiovascular:  Regular rate and rhythm. No peripheral edema, cyanosis or pallor.  Gastrointestinal:  Soft, nondistended, nontender. No rebound or guarding. Normal bowel sounds. No appreciable masses or hepatomegaly. Rectal:  Declines Msk:  Symmetrical without gross deformities. Without edema, no deformity or joint abnormality.  Neurologic:  Alert and  oriented x4;  grossly normal neurologically.  Skin:   Dry and intact without significant lesions or rashes. Psychiatric: Oriented to person, place and time. Demonstrates good judgement and reason without abnormal affect or behaviors.   RELEVANT LABS AND IMAGING: CBC    Component Value Date/Time   WBC 10.2 05/02/2016 1450   RBC 5.00 05/02/2016 1450   HGB 15.7 (H) 05/02/2016 1450   HCT 45.6 05/02/2016 1450   PLT 254.0 05/02/2016 1450   MCV 91.3 05/02/2016 1450   MCHC 34.3 05/02/2016 1450   RDW 13.4 05/02/2016 1450    CMP     Component Value Date/Time   NA 138 05/02/2016 1450   NA 139 09/04/2015 0000   K 4.2 05/02/2016 1450   CL 102 05/02/2016 1450   CO2 28 05/02/2016 1450   GLUCOSE 95 05/02/2016 1450   BUN 11 05/02/2016 1450   BUN 13 09/04/2015 0000   CREATININE 0.88 05/02/2016 1450   CALCIUM 9.5 05/02/2016 1450   PROT 7.2 05/02/2016 1450   ALBUMIN 4.4  05/02/2016 1450   AST 11 05/02/2016 1450   ALT 12 05/02/2016 1450  ALKPHOS 40 05/02/2016 1450   BILITOT 0.7 05/02/2016 1450     Assessment/Plan:   Assessment and Plan Assessment & Plan  Chronic loose stools Chronic diarrhea with intermittent flares, likely dietary-related. Labs with endocrinology (shown on her phone) were normal.  Colonoscopy 2018 showed a normal terminal ileum, patchy area of mild circumferential inflammation at her hepatic flexure, a single polyp.  The polyp was hyperplastic, random colon biopsies were all normal, the area of patchy inflammation in her hepatic flexure was mild acute inflammation on pathology.  On Lialda  4 pills daily with good response (2 loose stools per day). Imodium results in cramping. No improvement in her diarrhea despite being on chronic steroids for her endocrine problems. - Continue Lialda  1.2 grams, four pills daily. - Initiate Benefiber, one to two times a day, adjust dose based on response. - Monitor symptoms, communicate via MyChart regarding Benefiber effectiveness. - Schedule follow-up in eight weeks  Adrenal Insufficiency Pituitary tumor Adrenal insufficiency secondary to previous Cushing's disease, managed with chronic steroid therapy since 2007-2008. No significant change in diarrhea symptoms with steroid use.    This visit required 35 minutes of patient care (this includes precharting, chart review, review of results, face-to-face time used for counseling as well as treatment plan and follow-up. The patient was provided an opportunity to ask questions and all were answered. The patient agreed with the plan and demonstrated an understanding of the instructions.   Suzanna Erp, PA-C  Gastroenterology 05/04/2023, 12:23 PM  Cc: No ref. provider found

## 2023-05-04 NOTE — Patient Instructions (Addendum)
 A high fiber diet with plenty of fluids (up to 8 glasses of water daily) is suggested to relieve these symptoms.  benefiber, 1 tablespoon once or twice daily can be used to keep bowels regular if needed.   We have sent the following medications to your pharmacy for you to pick up at your convenience: Mesalamine  1.2g take 4 tablets by mouth with breakfast  Please follow up as needed if symptoms increase or worsen  Due to recent changes in healthcare laws, you may see the results of your imaging and laboratory studies on MyChart before your provider has had a chance to review them.  We understand that in some cases there may be results that are confusing or concerning to you. Not all laboratory results come back in the same time frame and the provider may be waiting for multiple results in order to interpret others.  Please give us  48 hours in order for your provider to thoroughly review all the results before contacting the office for clarification of your results.   _______________________________________________________  If your blood pressure at your visit was 140/90 or greater, please contact your primary care physician to follow up on this.  _______________________________________________________  If you are age 46 or older, your body mass index should be between 23-30. Your Body mass index is 40.94 kg/m. If this is out of the aforementioned range listed, please consider follow up with your Primary Care Provider.  If you are age 44 or younger, your body mass index should be between 19-25. Your Body mass index is 40.94 kg/m. If this is out of the aformentioned range listed, please consider follow up with your Primary Care Provider.   ________________________________________________________  The [facility] providers would like to encourage you to use MYCHART to communicate with providers for non-urgent requests or questions.  Due to long hold times on the telephone, sending your provider a  message by [facility] may be a faster and more efficient way to get a response.  Please allow 48 business hours for a response.  Please remember that this is for non-urgent requests.  _______________________________________________________ Thank you for trusting me with your gastrointestinal care!   Suzanna Erp, PA

## 2023-05-04 NOTE — Progress Notes (Signed)
 Noted.

## 2023-07-14 ENCOUNTER — Ambulatory Visit: Admitting: Nurse Practitioner

## 2023-08-03 ENCOUNTER — Other Ambulatory Visit: Payer: Self-pay | Admitting: Internal Medicine

## 2023-12-14 ENCOUNTER — Other Ambulatory Visit: Payer: Self-pay | Admitting: Internal Medicine

## 2024-02-08 ENCOUNTER — Ambulatory Visit: Admitting: Gastroenterology

## 2024-02-16 ENCOUNTER — Other Ambulatory Visit: Payer: Self-pay

## 2024-02-16 DIAGNOSIS — K2990 Gastroduodenitis, unspecified, without bleeding: Secondary | ICD-10-CM

## 2024-02-16 MED ORDER — PANTOPRAZOLE SODIUM 40 MG PO TBEC: DELAYED_RELEASE_TABLET | ORAL | 1 refills | Status: AC

## 2024-02-16 NOTE — Telephone Encounter (Signed)
 Pantoprazole  refilled, saw Con Blower, [place] in April 2025.

## 2024-03-01 ENCOUNTER — Ambulatory Visit: Admitting: Gastroenterology
